# Patient Record
Sex: Female | Born: 1985 | Race: Black or African American | Hispanic: No | Marital: Married | Smoking: Never smoker
Health system: Southern US, Community
[De-identification: ages and names within clinical notes are randomized; demographics above are authoritative.]

## PROBLEM LIST (undated history)

## (undated) ENCOUNTER — Inpatient Hospital Stay (HOSPITAL_COMMUNITY): Payer: Self-pay

## (undated) DIAGNOSIS — R609 Edema, unspecified: Secondary | ICD-10-CM

## (undated) DIAGNOSIS — D649 Anemia, unspecified: Secondary | ICD-10-CM

## (undated) DIAGNOSIS — F419 Anxiety disorder, unspecified: Secondary | ICD-10-CM

## (undated) DIAGNOSIS — N63 Unspecified lump in unspecified breast: Secondary | ICD-10-CM

## (undated) DIAGNOSIS — Z5189 Encounter for other specified aftercare: Secondary | ICD-10-CM

## (undated) DIAGNOSIS — R87619 Unspecified abnormal cytological findings in specimens from cervix uteri: Secondary | ICD-10-CM

## (undated) DIAGNOSIS — K429 Umbilical hernia without obstruction or gangrene: Secondary | ICD-10-CM

## (undated) DIAGNOSIS — K589 Irritable bowel syndrome without diarrhea: Secondary | ICD-10-CM

## (undated) HISTORY — PX: COLPOSCOPY: SHX161

## (undated) HISTORY — DX: Irritable bowel syndrome, unspecified: K58.9

## (undated) HISTORY — DX: Anxiety disorder, unspecified: F41.9

## (undated) HISTORY — PX: COLONOSCOPY: SHX174

## (undated) HISTORY — DX: Edema, unspecified: R60.9

## (undated) HISTORY — DX: Umbilical hernia without obstruction or gangrene: K42.9

---
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
## 2007-05-13 ENCOUNTER — Emergency Department (HOSPITAL_COMMUNITY): Admission: EM | Admit: 2007-05-13 | Discharge: 2007-05-13 | Payer: Self-pay | Admitting: Emergency Medicine

## 2007-06-08 ENCOUNTER — Emergency Department (HOSPITAL_COMMUNITY): Admission: EM | Admit: 2007-06-08 | Discharge: 2007-06-08 | Payer: Self-pay | Admitting: Emergency Medicine

## 2007-07-03 ENCOUNTER — Emergency Department (HOSPITAL_COMMUNITY): Admission: EM | Admit: 2007-07-03 | Discharge: 2007-07-03 | Payer: Self-pay | Admitting: Emergency Medicine

## 2007-07-09 ENCOUNTER — Emergency Department (HOSPITAL_COMMUNITY): Admission: EM | Admit: 2007-07-09 | Discharge: 2007-07-09 | Payer: Self-pay | Admitting: Family Medicine

## 2007-07-23 ENCOUNTER — Emergency Department (HOSPITAL_COMMUNITY): Admission: EM | Admit: 2007-07-23 | Discharge: 2007-07-24 | Payer: Self-pay | Admitting: Emergency Medicine

## 2007-08-12 ENCOUNTER — Emergency Department (HOSPITAL_COMMUNITY): Admission: EM | Admit: 2007-08-12 | Discharge: 2007-08-12 | Payer: Self-pay | Admitting: Emergency Medicine

## 2007-10-19 ENCOUNTER — Emergency Department (HOSPITAL_COMMUNITY): Admission: EM | Admit: 2007-10-19 | Discharge: 2007-10-19 | Payer: Self-pay | Admitting: Internal Medicine

## 2008-01-10 ENCOUNTER — Emergency Department (HOSPITAL_COMMUNITY): Admission: EM | Admit: 2008-01-10 | Discharge: 2008-01-10 | Payer: Self-pay | Admitting: Family Medicine

## 2008-01-24 ENCOUNTER — Inpatient Hospital Stay (HOSPITAL_COMMUNITY): Admission: AD | Admit: 2008-01-24 | Discharge: 2008-01-25 | Payer: Self-pay | Admitting: Family Medicine

## 2008-02-23 ENCOUNTER — Ambulatory Visit: Payer: Self-pay | Admitting: Obstetrics & Gynecology

## 2008-03-29 ENCOUNTER — Inpatient Hospital Stay (HOSPITAL_COMMUNITY): Admission: AD | Admit: 2008-03-29 | Discharge: 2008-03-29 | Payer: Self-pay | Admitting: Family Medicine

## 2008-05-17 ENCOUNTER — Ambulatory Visit: Payer: Self-pay | Admitting: Advanced Practice Midwife

## 2008-05-17 ENCOUNTER — Inpatient Hospital Stay (HOSPITAL_COMMUNITY): Admission: AD | Admit: 2008-05-17 | Discharge: 2008-05-17 | Payer: Self-pay | Admitting: Obstetrics and Gynecology

## 2008-06-05 ENCOUNTER — Inpatient Hospital Stay (HOSPITAL_COMMUNITY): Admission: AD | Admit: 2008-06-05 | Discharge: 2008-06-05 | Payer: Self-pay | Admitting: Obstetrics and Gynecology

## 2008-07-03 ENCOUNTER — Inpatient Hospital Stay (HOSPITAL_COMMUNITY): Admission: AD | Admit: 2008-07-03 | Discharge: 2008-07-03 | Payer: Self-pay | Admitting: Obstetrics & Gynecology

## 2008-07-04 ENCOUNTER — Encounter: Payer: Self-pay | Admitting: Emergency Medicine

## 2008-07-04 ENCOUNTER — Inpatient Hospital Stay (HOSPITAL_COMMUNITY): Admission: AD | Admit: 2008-07-04 | Discharge: 2008-07-07 | Payer: Self-pay | Admitting: Obstetrics and Gynecology

## 2008-07-10 ENCOUNTER — Inpatient Hospital Stay (HOSPITAL_COMMUNITY): Admission: AD | Admit: 2008-07-10 | Discharge: 2008-07-10 | Payer: Self-pay | Admitting: Obstetrics and Gynecology

## 2008-08-10 ENCOUNTER — Inpatient Hospital Stay (HOSPITAL_COMMUNITY): Admission: AD | Admit: 2008-08-10 | Discharge: 2008-08-12 | Payer: Self-pay | Admitting: Obstetrics and Gynecology

## 2008-09-14 ENCOUNTER — Inpatient Hospital Stay (HOSPITAL_COMMUNITY): Admission: AD | Admit: 2008-09-14 | Discharge: 2008-09-15 | Payer: Self-pay | Admitting: Obstetrics and Gynecology

## 2008-10-25 ENCOUNTER — Inpatient Hospital Stay (HOSPITAL_COMMUNITY): Admission: AD | Admit: 2008-10-25 | Discharge: 2008-10-25 | Payer: Self-pay | Admitting: Obstetrics & Gynecology

## 2009-03-31 ENCOUNTER — Emergency Department (HOSPITAL_COMMUNITY): Admission: EM | Admit: 2009-03-31 | Discharge: 2009-03-31 | Payer: Self-pay | Admitting: Emergency Medicine

## 2009-10-20 ENCOUNTER — Emergency Department (HOSPITAL_COMMUNITY): Admission: EM | Admit: 2009-10-20 | Discharge: 2009-10-20 | Payer: Self-pay | Admitting: Family Medicine

## 2009-12-27 ENCOUNTER — Emergency Department (HOSPITAL_COMMUNITY): Admission: EM | Admit: 2009-12-27 | Discharge: 2009-12-27 | Payer: Self-pay | Admitting: Family Medicine

## 2010-05-17 LAB — POCT URINALYSIS DIPSTICK
Protein, ur: NEGATIVE mg/dL
Urobilinogen, UA: 2 mg/dL — ABNORMAL HIGH (ref 0.0–1.0)

## 2010-05-17 LAB — POCT PREGNANCY, URINE: Preg Test, Ur: NEGATIVE

## 2010-05-17 LAB — WET PREP, GENITAL
Trich, Wet Prep: NONE SEEN
Yeast Wet Prep HPF POC: NONE SEEN

## 2010-05-17 LAB — GC/CHLAMYDIA PROBE AMP, GENITAL: GC Probe Amp, Genital: NEGATIVE

## 2010-06-08 LAB — COMPREHENSIVE METABOLIC PANEL
ALT: 40 U/L — ABNORMAL HIGH (ref 0–35)
Albumin: 3.8 g/dL (ref 3.5–5.2)
Alkaline Phosphatase: 72 U/L (ref 39–117)
BUN: 8 mg/dL (ref 6–23)
CO2: 27 mEq/L (ref 19–32)
Calcium: 9.1 mg/dL (ref 8.4–10.5)
Chloride: 106 mEq/L (ref 96–112)
Creatinine, Ser: 0.63 mg/dL (ref 0.4–1.2)
Sodium: 138 mEq/L (ref 135–145)
Total Bilirubin: 0.4 mg/dL (ref 0.3–1.2)
Total Protein: 7.2 g/dL (ref 6.0–8.3)

## 2010-06-08 LAB — URINALYSIS, ROUTINE W REFLEX MICROSCOPIC
Glucose, UA: NEGATIVE mg/dL
Leukocytes, UA: NEGATIVE
Nitrite: NEGATIVE
Specific Gravity, Urine: 1.025 (ref 1.005–1.030)
Urobilinogen, UA: 0.2 mg/dL (ref 0.0–1.0)

## 2010-06-08 LAB — DIFFERENTIAL
Eosinophils Absolute: 0.1 10*3/uL (ref 0.0–0.7)
Eosinophils Relative: 2 % (ref 0–5)
Lymphocytes Relative: 34 % (ref 12–46)
Lymphs Abs: 1.4 10*3/uL (ref 0.7–4.0)
Monocytes Absolute: 0.3 10*3/uL (ref 0.1–1.0)
Monocytes Relative: 7 % (ref 3–12)
Neutro Abs: 2.4 10*3/uL (ref 1.7–7.7)

## 2010-06-08 LAB — URINE MICROSCOPIC-ADD ON

## 2010-06-08 LAB — CBC
Hemoglobin: 10.6 g/dL — ABNORMAL LOW (ref 12.0–15.0)
MCHC: 32.8 g/dL (ref 30.0–36.0)
Platelets: 284 10*3/uL (ref 150–400)
RDW: 15.5 % (ref 11.5–15.5)

## 2010-06-08 LAB — POCT PREGNANCY, URINE: Preg Test, Ur: NEGATIVE

## 2010-06-09 LAB — CBC
Hemoglobin: 11.2 g/dL — ABNORMAL LOW (ref 12.0–15.0)
RBC: 4.11 MIL/uL (ref 3.87–5.11)

## 2010-06-10 LAB — CBC
Hemoglobin: 11.2 g/dL — ABNORMAL LOW (ref 12.0–15.0)
MCHC: 33.5 g/dL (ref 30.0–36.0)
MCV: 82.7 fL (ref 78.0–100.0)
RBC: 4.45 MIL/uL (ref 3.87–5.11)
RDW: 23.9 % — ABNORMAL HIGH (ref 11.5–15.5)
WBC: 12.1 10*3/uL — ABNORMAL HIGH (ref 4.0–10.5)

## 2010-06-11 LAB — URINALYSIS, ROUTINE W REFLEX MICROSCOPIC
Hgb urine dipstick: NEGATIVE
Ketones, ur: NEGATIVE mg/dL
Protein, ur: NEGATIVE mg/dL
pH: 7.5 (ref 5.0–8.0)

## 2010-06-11 LAB — POCT I-STAT, CHEM 8
Chloride: 107 mEq/L (ref 96–112)
Creatinine, Ser: 0.6 mg/dL (ref 0.4–1.2)
Glucose, Bld: 123 mg/dL — ABNORMAL HIGH (ref 70–99)
HCT: 31 % — ABNORMAL LOW (ref 36.0–46.0)
Hemoglobin: 10.5 g/dL — ABNORMAL LOW (ref 12.0–15.0)
Potassium: 3.3 mEq/L — ABNORMAL LOW (ref 3.5–5.1)
Sodium: 136 mEq/L (ref 135–145)

## 2010-06-11 LAB — DIFFERENTIAL
Basophils Absolute: 0 10*3/uL (ref 0.0–0.1)
Basophils Relative: 0 % (ref 0–1)
Eosinophils Absolute: 0.1 10*3/uL (ref 0.0–0.7)
Eosinophils Relative: 1 % (ref 0–5)
Lymphocytes Relative: 17 % (ref 12–46)
Lymphs Abs: 1.3 10*3/uL (ref 0.7–4.0)
Monocytes Relative: 6 % (ref 3–12)
Neutrophils Relative %: 76 % (ref 43–77)

## 2010-06-11 LAB — CBC
HCT: 29.9 % — ABNORMAL LOW (ref 36.0–46.0)
Hemoglobin: 9.9 g/dL — ABNORMAL LOW (ref 12.0–15.0)
MCHC: 33.1 g/dL (ref 30.0–36.0)
MCV: 77.6 fL — ABNORMAL LOW (ref 78.0–100.0)
MCV: 79.5 fL (ref 78.0–100.0)
Platelets: 172 10*3/uL (ref 150–400)
RBC: 3.85 MIL/uL — ABNORMAL LOW (ref 3.87–5.11)
RDW: 21.4 % — ABNORMAL HIGH (ref 11.5–15.5)
RDW: 21.8 % — ABNORMAL HIGH (ref 11.5–15.5)
WBC: 8.9 10*3/uL (ref 4.0–10.5)

## 2010-06-11 LAB — RPR: RPR Ser Ql: NONREACTIVE

## 2010-06-13 LAB — URINALYSIS, ROUTINE W REFLEX MICROSCOPIC
Bilirubin Urine: NEGATIVE
Hgb urine dipstick: NEGATIVE
Ketones, ur: NEGATIVE mg/dL
Nitrite: NEGATIVE
Urobilinogen, UA: 0.2 mg/dL (ref 0.0–1.0)
pH: 7 (ref 5.0–8.0)

## 2010-06-17 LAB — URINALYSIS, ROUTINE W REFLEX MICROSCOPIC
Bilirubin Urine: NEGATIVE
Glucose, UA: 500 mg/dL — AB
Hgb urine dipstick: NEGATIVE
Specific Gravity, Urine: 1.01 (ref 1.005–1.030)
Urobilinogen, UA: 2 mg/dL — ABNORMAL HIGH (ref 0.0–1.0)

## 2010-06-17 LAB — GLUCOSE, CAPILLARY: Glucose-Capillary: 107 mg/dL — ABNORMAL HIGH (ref 70–99)

## 2010-06-29 ENCOUNTER — Inpatient Hospital Stay (HOSPITAL_COMMUNITY)
Admission: AD | Admit: 2010-06-29 | Discharge: 2010-06-29 | Disposition: A | Discharge status: Home / Self Care | Payer: BC Managed Care – PPO | Source: Ambulatory Visit | Attending: Obstetrics and Gynecology | Admitting: Obstetrics and Gynecology

## 2010-06-29 DIAGNOSIS — R1013 Epigastric pain: Secondary | ICD-10-CM | POA: Diagnosis present

## 2010-06-29 DIAGNOSIS — K219 Gastro-esophageal reflux disease without esophagitis: Secondary | ICD-10-CM | POA: Diagnosis not present

## 2010-06-29 LAB — POCT PREGNANCY, URINE: Preg Test, Ur: NEGATIVE

## 2010-06-29 LAB — URINALYSIS, ROUTINE W REFLEX MICROSCOPIC
Bilirubin Urine: NEGATIVE
Nitrite: NEGATIVE
Specific Gravity, Urine: 1.01 (ref 1.005–1.030)
Urobilinogen, UA: 0.2 mg/dL (ref 0.0–1.0)
pH: 6.5 (ref 5.0–8.0)

## 2010-07-05 ENCOUNTER — Inpatient Hospital Stay (HOSPITAL_COMMUNITY): Payer: BC Managed Care – PPO

## 2010-07-05 ENCOUNTER — Inpatient Hospital Stay (HOSPITAL_COMMUNITY)
Admission: AD | Admit: 2010-07-05 | Discharge: 2010-07-05 | Disposition: A | Discharge status: Home / Self Care | Payer: BC Managed Care – PPO | Source: Ambulatory Visit | Attending: Obstetrics & Gynecology | Admitting: Obstetrics & Gynecology

## 2010-07-05 DIAGNOSIS — R109 Unspecified abdominal pain: Secondary | ICD-10-CM

## 2010-07-05 DIAGNOSIS — O209 Hemorrhage in early pregnancy, unspecified: Secondary | ICD-10-CM | POA: Insufficient documentation

## 2010-07-05 LAB — URINALYSIS, ROUTINE W REFLEX MICROSCOPIC
Ketones, ur: 15 mg/dL — AB
Nitrite: NEGATIVE
Protein, ur: NEGATIVE mg/dL
Urobilinogen, UA: 1 mg/dL (ref 0.0–1.0)

## 2010-07-05 LAB — CBC
HCT: 34.6 % — ABNORMAL LOW (ref 36.0–46.0)
MCH: 25.4 pg — ABNORMAL LOW (ref 26.0–34.0)
MCV: 80.7 fL (ref 78.0–100.0)
Platelets: 284 10*3/uL (ref 150–400)
RDW: 14 % (ref 11.5–15.5)
WBC: 4.8 10*3/uL (ref 4.0–10.5)

## 2010-07-05 LAB — DIFFERENTIAL
Eosinophils Absolute: 0.1 10*3/uL (ref 0.0–0.7)
Eosinophils Relative: 3 % (ref 0–5)
Lymphocytes Relative: 40 % (ref 12–46)
Lymphs Abs: 1.9 10*3/uL (ref 0.7–4.0)
Monocytes Absolute: 0.4 10*3/uL (ref 0.1–1.0)
Monocytes Relative: 9 % (ref 3–12)

## 2010-07-05 LAB — POCT PREGNANCY, URINE: Preg Test, Ur: POSITIVE

## 2010-07-05 LAB — WET PREP, GENITAL: Yeast Wet Prep HPF POC: NONE SEEN

## 2010-07-06 LAB — GC/CHLAMYDIA PROBE AMP, GENITAL: Chlamydia, DNA Probe: NEGATIVE

## 2010-07-07 ENCOUNTER — Inpatient Hospital Stay (HOSPITAL_COMMUNITY)
Admission: AD | Admit: 2010-07-07 | Discharge: 2010-07-07 | Disposition: A | Discharge status: Home / Self Care | Payer: BC Managed Care – PPO | Source: Ambulatory Visit | Attending: Obstetrics & Gynecology | Admitting: Obstetrics & Gynecology

## 2010-07-07 DIAGNOSIS — O99891 Other specified diseases and conditions complicating pregnancy: Secondary | ICD-10-CM | POA: Insufficient documentation

## 2010-07-07 DIAGNOSIS — R109 Unspecified abdominal pain: Secondary | ICD-10-CM | POA: Insufficient documentation

## 2010-07-07 LAB — HCG, QUANTITATIVE, PREGNANCY: hCG, Beta Chain, Quant, S: 2197 m[IU]/mL — ABNORMAL HIGH (ref <5)

## 2010-07-08 ENCOUNTER — Other Ambulatory Visit: Payer: Self-pay | Admitting: Obstetrics & Gynecology

## 2010-07-08 DIAGNOSIS — R52 Pain, unspecified: Secondary | ICD-10-CM

## 2010-07-15 ENCOUNTER — Ambulatory Visit (HOSPITAL_COMMUNITY)
Admission: RE | Admit: 2010-07-15 | Discharge: 2010-07-15 | Disposition: A | Discharge status: Home / Self Care | Payer: Medicaid Other | Source: Ambulatory Visit | Attending: Obstetrics & Gynecology | Admitting: Obstetrics & Gynecology

## 2010-07-15 DIAGNOSIS — R52 Pain, unspecified: Secondary | ICD-10-CM

## 2010-07-15 DIAGNOSIS — Z3689 Encounter for other specified antenatal screening: Secondary | ICD-10-CM | POA: Insufficient documentation

## 2010-07-15 DIAGNOSIS — O99891 Other specified diseases and conditions complicating pregnancy: Secondary | ICD-10-CM | POA: Insufficient documentation

## 2010-07-15 DIAGNOSIS — R1031 Right lower quadrant pain: Secondary | ICD-10-CM | POA: Insufficient documentation
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
## 2010-09-24 ENCOUNTER — Inpatient Hospital Stay (HOSPITAL_COMMUNITY)
Admission: AD | Admit: 2010-09-24 | Discharge: 2010-09-24 | Disposition: A | Discharge status: Home / Self Care | Payer: BC Managed Care – PPO | Source: Ambulatory Visit | Attending: Obstetrics and Gynecology | Admitting: Obstetrics and Gynecology

## 2010-09-24 ENCOUNTER — Inpatient Hospital Stay (HOSPITAL_COMMUNITY): Payer: BC Managed Care – PPO

## 2010-09-24 ENCOUNTER — Encounter (HOSPITAL_COMMUNITY): Payer: Self-pay

## 2010-09-24 DIAGNOSIS — R109 Unspecified abdominal pain: Secondary | ICD-10-CM | POA: Insufficient documentation

## 2010-09-24 DIAGNOSIS — O4692 Antepartum hemorrhage, unspecified, second trimester: Secondary | ICD-10-CM

## 2010-09-24 DIAGNOSIS — O4690 Antepartum hemorrhage, unspecified, unspecified trimester: Secondary | ICD-10-CM | POA: Insufficient documentation

## 2010-09-24 DIAGNOSIS — O9989 Other specified diseases and conditions complicating pregnancy, childbirth and the puerperium: Secondary | ICD-10-CM | POA: Insufficient documentation

## 2010-09-24 DIAGNOSIS — R1084 Generalized abdominal pain: Secondary | ICD-10-CM

## 2010-09-24 HISTORY — DX: Anemia, unspecified: D64.9

## 2010-09-24 LAB — WET PREP, GENITAL
Trich, Wet Prep: NONE SEEN
Yeast Wet Prep HPF POC: NONE SEEN

## 2010-09-24 LAB — URINALYSIS, ROUTINE W REFLEX MICROSCOPIC
Hgb urine dipstick: NEGATIVE
Nitrite: NEGATIVE
Specific Gravity, Urine: 1.025 (ref 1.005–1.030)
Urobilinogen, UA: 0.2 mg/dL (ref 0.0–1.0)
pH: 7 (ref 5.0–8.0)

## 2010-09-24 LAB — ABO/RH: ABO/RH(D): O POS

## 2010-09-24 LAB — CBC
Hemoglobin: 11.3 g/dL — ABNORMAL LOW (ref 12.0–15.0)
Platelets: 225 10*3/uL (ref 150–400)
RBC: 4.31 MIL/uL (ref 3.87–5.11)
WBC: 9.1 10*3/uL (ref 4.0–10.5)

## 2010-09-24 LAB — URINE MICROSCOPIC-ADD ON

## 2010-09-24 MED ORDER — IBUPROFEN 200 MG PO TABS: 600.0000 mg | ORAL_TABLET | Freq: Four times a day (QID) | ORAL | Status: AC | PRN
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
## 2010-09-24 NOTE — ED Provider Notes (Signed)
[place]: SIUP, 16.0 weeks by early [place]. No evidence of abruption. Posterior placenta. +FHR
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
## 2010-09-25 LAB — GC/CHLAMYDIA PROBE AMP, GENITAL
Chlamydia, DNA Probe: NEGATIVE
GC Probe Amp, Genital: NEGATIVE

## 2010-11-20 ENCOUNTER — Inpatient Hospital Stay (HOSPITAL_COMMUNITY)
Admission: AD | Admit: 2010-11-20 | Discharge: 2010-11-20 | Disposition: A | Discharge status: Home / Self Care | Payer: BC Managed Care – PPO | Source: Ambulatory Visit | Attending: Obstetrics & Gynecology | Admitting: Obstetrics & Gynecology

## 2010-11-20 ENCOUNTER — Encounter (HOSPITAL_COMMUNITY): Payer: Self-pay | Admitting: *Deleted

## 2010-11-20 DIAGNOSIS — L02229 Furuncle of trunk, unspecified: Secondary | ICD-10-CM | POA: Insufficient documentation

## 2010-11-20 DIAGNOSIS — L738 Other specified follicular disorders: Secondary | ICD-10-CM | POA: Insufficient documentation

## 2010-11-20 DIAGNOSIS — L739 Follicular disorder, unspecified: Secondary | ICD-10-CM | POA: Diagnosis present

## 2010-11-20 DIAGNOSIS — L02239 Carbuncle of trunk, unspecified: Secondary | ICD-10-CM | POA: Insufficient documentation

## 2010-11-20 DIAGNOSIS — O99891 Other specified diseases and conditions complicating pregnancy: Secondary | ICD-10-CM | POA: Insufficient documentation

## 2010-11-20 HISTORY — DX: Unspecified abnormal cytological findings in specimens from cervix uteri: R87.619

## 2010-11-20 MED ORDER — SULFAMETHOXAZOLE-TRIMETHOPRIM 800-160 MG PO TABS: 1.0000 | ORAL_TABLET | Freq: Two times a day (BID) | ORAL | Status: AC

## 2010-11-20 NOTE — ED Provider Notes (Signed)
History     Chief Complaint  Patient presents with  . Recurrent Skin Infections   HPI Boil on right groin x 2 weeks, getting worse, seems to become more inflamed with warm compresses. Had boil in the same place about 1-2 months ago, went away after popping it, but then came back. Prenatal care with Dr. Arlyce Dice, normal prenatal course.   OB History    Grav Para Term Preterm Abortions TAB SAB Ect Mult Living   4 3 2 1      3       Past Medical History  Diagnosis Date  . Anemia   . Abnormal Pap smear of cervix     Past Surgical History  Procedure Date  . Colposcopy     No family history on file.  History  Substance Use Topics  . Smoking status: Never Smoker   . Smokeless tobacco: Not on file  . Alcohol Use: No    Allergies:  Allergies  Allergen Reactions  . Penicillins Anaphylaxis    Prescriptions prior to admission  Medication Sig Dispense Refill  . prenatal vitamin w/FE, FA (PRENATAL 1 + 1) 27-1 MG TABS Take 1 tablet by mouth daily.        Marland Kitchen acetaminophen (TYLENOL) 500 MG tablet Take 1,000 mg by mouth every 6 (six) hours as needed.          Review of Systems  Constitutional: Negative.   Respiratory: Negative.   Cardiovascular: Negative.   Gastrointestinal: Negative for nausea, vomiting, abdominal pain, diarrhea and constipation.  Genitourinary: Negative for dysuria, urgency, frequency, hematuria and flank pain.       Negative for vaginal bleeding, cramping/contractions  Musculoskeletal: Negative.   Skin:       + lesion on groin   Neurological: Negative.   Psychiatric/Behavioral: Negative.    Physical Exam   Blood pressure 129/80, pulse 85, temperature 98.4 F (36.9 C), temperature source Oral, resp. rate 16, height 5\' 6"  (1.676 m), weight 88.565 kg (195 lb 4 oz), last menstrual period 05/26/2010.  Physical Exam  Nursing note and vitals reviewed. Constitutional: She is oriented to person, place, and time. She appears well-developed and well-nourished.  No distress.  Cardiovascular: Normal rate.   Respiratory: Effort normal.  GI: Soft. There is no tenderness.  Musculoskeletal: Normal range of motion.  Lymphadenopathy:       Right: Inguinal adenopathy present.  Neurological: She is alert and oriented to person, place, and time.  Skin: Skin is warm and dry.     Psychiatric: She has a normal mood and affect.    MAU Course  Procedures  Rev'd with Dr. Aldona Bar, ok to I&D, send home with rx for abx with MRSA coverage  I&D of right groin folliculitis - area cleansed x 3 with betadine, small incision made centrally in each of two lesions, large amount of purulent drainage evacuated, culture collected. Pt tolerated procedure well.   Assessment and Plan  25 y.o. [id number] at [redacted]w[redacted]d Folliculitis s/p I&D Rx Bactrim DS bid x 10 days F/U in office as scheduled next week  Devarius Nelles 11/20/2010, 12:26 PM

## 2010-11-20 NOTE — Progress Notes (Signed)
Pt states she had ? Ingrown hair/boil a few weeks ago, now has another ?boil in same location, applied heat compress to area on r groin. Painful with walking.

## 2010-11-24 LAB — CULTURE, ROUTINE-ABSCESS

## 2010-11-25 LAB — URINALYSIS, ROUTINE W REFLEX MICROSCOPIC
Bilirubin Urine: NEGATIVE
Hgb urine dipstick: NEGATIVE
Nitrite: NEGATIVE
Protein, ur: NEGATIVE
Specific Gravity, Urine: 1.026
Urobilinogen, UA: 1

## 2010-11-25 LAB — I-STAT 8, (EC8 V) (CONVERTED LAB)
BUN: 11
Chloride: 106
Glucose, Bld: 91
HCT: 38
Hemoglobin: 12.9
Operator id: 146091
Sodium: 139

## 2010-11-26 LAB — URINALYSIS, ROUTINE W REFLEX MICROSCOPIC
Nitrite: NEGATIVE
Protein, ur: NEGATIVE
Specific Gravity, Urine: 1.023
Urobilinogen, UA: 0.2

## 2010-11-26 LAB — WET PREP, GENITAL: WBC, Wet Prep HPF POC: NONE SEEN

## 2010-11-26 LAB — RPR: RPR Ser Ql: NONREACTIVE

## 2010-11-26 LAB — POCT PREGNANCY, URINE: Preg Test, Ur: NEGATIVE

## 2010-11-27 LAB — URINALYSIS, ROUTINE W REFLEX MICROSCOPIC
Hgb urine dipstick: NEGATIVE
Specific Gravity, Urine: 1.02
Urobilinogen, UA: 1
pH: 6.5

## 2010-11-27 LAB — POCT PREGNANCY, URINE
Operator id: 173591
Preg Test, Ur: NEGATIVE

## 2010-11-27 LAB — GC/CHLAMYDIA PROBE AMP, GENITAL: GC Probe Amp, Genital: NEGATIVE

## 2010-11-27 LAB — WET PREP, GENITAL

## 2010-11-28 LAB — POCT PREGNANCY, URINE
Operator id: 239701
Preg Test, Ur: NEGATIVE

## 2010-11-28 LAB — POCT URINALYSIS DIP (DEVICE)
Bilirubin Urine: NEGATIVE
Nitrite: NEGATIVE
Protein, ur: 30 — AB
pH: 6

## 2010-12-03 LAB — POCT URINALYSIS DIP (DEVICE)
Hgb urine dipstick: NEGATIVE
Nitrite: NEGATIVE
Protein, ur: NEGATIVE mg/dL
Specific Gravity, Urine: 1.02 (ref 1.005–1.030)
Urobilinogen, UA: 0.2 mg/dL (ref 0.0–1.0)
pH: 7 (ref 5.0–8.0)

## 2010-12-03 LAB — URINALYSIS, ROUTINE W REFLEX MICROSCOPIC
Bilirubin Urine: NEGATIVE
Glucose, UA: 500 — AB
Hgb urine dipstick: NEGATIVE
Ketones, ur: NEGATIVE
Nitrite: NEGATIVE
Specific Gravity, Urine: 1.015
pH: 7.5

## 2010-12-03 LAB — WET PREP, GENITAL
Clue Cells Wet Prep HPF POC: NONE SEEN
Yeast Wet Prep HPF POC: NONE SEEN

## 2010-12-03 LAB — GC/CHLAMYDIA PROBE AMP, GENITAL
Chlamydia, DNA Probe: NEGATIVE
GC Probe Amp, Genital: NEGATIVE

## 2010-12-03 LAB — GLUCOSE, CAPILLARY

## 2010-12-06 LAB — ABO/RH: RH Type: POSITIVE

## 2010-12-06 LAB — POCT URINALYSIS DIP (DEVICE)
Bilirubin Urine: NEGATIVE
Glucose, UA: NEGATIVE mg/dL
Hgb urine dipstick: NEGATIVE
Specific Gravity, Urine: 1.02 (ref 1.005–1.030)
Urobilinogen, UA: 0.2 mg/dL (ref 0.0–1.0)
pH: 7 (ref 5.0–8.0)

## 2010-12-06 LAB — HIV ANTIBODY (ROUTINE TESTING W REFLEX): HIV: NONREACTIVE

## 2010-12-13 LAB — GC/CHLAMYDIA PROBE AMP, GENITAL
Chlamydia, DNA Probe: NEGATIVE
GC Probe Amp, Genital: NEGATIVE

## 2010-12-13 LAB — URINALYSIS, ROUTINE W REFLEX MICROSCOPIC
Bilirubin Urine: NEGATIVE
Glucose, UA: NEGATIVE
Specific Gravity, Urine: 1.021

## 2010-12-13 LAB — URINE MICROSCOPIC-ADD ON

## 2011-02-07 ENCOUNTER — Encounter (HOSPITAL_COMMUNITY): Payer: Self-pay

## 2011-02-07 ENCOUNTER — Observation Stay (HOSPITAL_COMMUNITY)
Admission: AD | Admit: 2011-02-07 | Discharge: 2011-02-08 | Disposition: A | Discharge status: Home / Self Care | Payer: BC Managed Care – PPO | Source: Ambulatory Visit | Attending: Obstetrics and Gynecology | Admitting: Obstetrics and Gynecology

## 2011-02-07 DIAGNOSIS — O4700 False labor before 37 completed weeks of gestation, unspecified trimester: Principal | ICD-10-CM | POA: Insufficient documentation

## 2011-02-07 HISTORY — DX: Encounter for other specified aftercare: Z51.89

## 2011-02-07 LAB — URINALYSIS, ROUTINE W REFLEX MICROSCOPIC
Leukocytes, UA: NEGATIVE
Nitrite: NEGATIVE
Specific Gravity, Urine: 1.025 (ref 1.005–1.030)
Urobilinogen, UA: 0.2 mg/dL (ref 0.0–1.0)

## 2011-02-07 MED ORDER — HYDROXYZINE HCL 50 MG/ML IM SOLN
50.0000 mg | INTRAMUSCULAR | Status: DC | PRN
  Filled 2011-02-07: qty 1

## 2011-02-07 MED ORDER — ZOLPIDEM TARTRATE 10 MG PO TABS
10.0000 mg | ORAL_TABLET | Freq: Once | ORAL | Status: AC
  Administered 2011-02-07: 10 mg via ORAL
  Filled 2011-02-07: qty 1

## 2011-02-07 MED ORDER — OXYTOCIN 20 UNITS IN LACTATED RINGERS INFUSION - SIMPLE: 125.0000 mL/h | Freq: Once | INTRAVENOUS | Status: DC

## 2011-02-07 MED ORDER — LACTATED RINGERS IV SOLN: INTRAVENOUS | Status: DC

## 2011-02-07 MED ORDER — ONDANSETRON HCL 4 MG/2ML IJ SOLN: 4.0000 mg | Freq: Four times a day (QID) | INTRAMUSCULAR | Status: DC | PRN

## 2011-02-07 MED ORDER — LACTATED RINGERS IV SOLN: 500.0000 mL | INTRAVENOUS | Status: DC | PRN

## 2011-02-07 MED ORDER — OXYTOCIN BOLUS FROM INFUSION
500.0000 mL | Freq: Once | INTRAVENOUS | Status: DC
  Filled 2011-02-07: qty 500

## 2011-02-07 MED ORDER — CITRIC ACID-SODIUM CITRATE 334-500 MG/5ML PO SOLN: 30.0000 mL | ORAL | Status: DC | PRN

## 2011-02-07 MED ORDER — BUTORPHANOL TARTRATE 2 MG/ML IJ SOLN: 1.0000 mg | INTRAMUSCULAR | Status: DC | PRN

## 2011-02-07 MED ORDER — CEFAZOLIN SODIUM 1-5 GM-% IV SOLN
1.0000 g | Freq: Three times a day (TID) | INTRAVENOUS | Status: DC
  Filled 2011-02-07: qty 50

## 2011-02-07 MED ORDER — OXYCODONE-ACETAMINOPHEN 5-325 MG PO TABS: 2.0000 | ORAL_TABLET | ORAL | Status: DC | PRN

## 2011-02-07 MED ORDER — NALBUPHINE SYRINGE 5 MG/0.5 ML
10.0000 mg | INJECTION | INTRAMUSCULAR | Status: DC | PRN
  Filled 2011-02-07: qty 1

## 2011-02-07 MED ORDER — FLEET ENEMA 7-19 GM/118ML RE ENEM: 1.0000 | ENEMA | RECTAL | Status: DC | PRN

## 2011-02-07 MED ORDER — CEFAZOLIN SODIUM-DEXTROSE 2-3 GM-% IV SOLR
2.0000 g | Freq: Once | INTRAVENOUS | Status: DC
  Filled 2011-02-07: qty 50

## 2011-02-07 MED ORDER — LIDOCAINE HCL (PF) 1 % IJ SOLN: 30.0000 mL | INTRAMUSCULAR | Status: DC | PRN

## 2011-02-07 MED ORDER — ACETAMINOPHEN 325 MG PO TABS: 650.0000 mg | ORAL_TABLET | ORAL | Status: DC | PRN

## 2011-02-07 MED ORDER — IBUPROFEN 600 MG PO TABS: 600.0000 mg | ORAL_TABLET | Freq: Four times a day (QID) | ORAL | Status: DC | PRN

## 2011-02-07 NOTE — ED Notes (Signed)
Report given to K. Ross MD patient G4P3 36.5 c/o labor, contractions every 3-4 minutes mild to moderate, fhr reactive, cervix 2.5/70/-1, per Dr. Tenny Craw sent CCUA, either monitor patient for additional 1-2 hours or have patient walk.

## 2011-02-07 NOTE — H&P (Signed)
Patricia Macdonald is a 25 y.o. [phone] @ 36+5 presenting for uncomfortable contractions.   Pt has been having irregular contractions for several days.  Pregnancy complicated by late prenatal care.  Denies LOF or bleeding.  Cervix 2-3/70/-1 and has not changed in several hours.  Given preterm status patient understands that labor augmentation will not occur unless her cervix changes. Pt admitted for obs due to painful contractions. History OB History    Grav Para Term Preterm Abortions TAB SAB Ect Mult Living   4 3 2 1      3      Past Medical History  Diagnosis Date  . Anemia   . Abnormal Pap smear of cervix    Past Surgical History  Procedure Date  . Colposcopy    Family History: family history includes Diabetes in her mother. Social History:  reports that she has never smoked. She does not have any smokeless tobacco history on file. She reports that she does not drink alcohol or use illicit drugs.  ROS: as above  Dilation: 2.5 Effacement (%): 70 Station: -1 Exam by:: SBeck, RN Blood pressure 129/87, pulse 104, temperature 99.1 F (37.3 C), temperature source Oral, resp. rate 20, height 5\' 6"  (1.676 m), weight 93.985 kg (207 lb 3.2 oz), last menstrual period 05/26/2010, SpO2 97.00%. Exam Physical Exam  Prenatal labs: ABO, Rh: --/--/O POS (07/24 2050) Antibody:  Negative Rubella:  Immune RPR:   NR HBsAg:   Neg HIV:   NR GBS:     Assessment/Plan: Admit for obs Pain medication Ambien  Bryden Darden H. 02/07/2011, 7:57 PM

## 2011-02-07 NOTE — Progress Notes (Signed)
MD phone for order clarification per pharmacy. New orders received.

## 2011-02-07 NOTE — Progress Notes (Signed)
Patient states she has been having contractions every 4 minutes for about one hour. Has had a white vaginal discharge that made her panties wet. Reports good fetal movement. No bleeding.

## 2011-02-07 NOTE — ED Notes (Signed)
Patient was taken off efm to walk, plan of care was discussed with patient, she verbalized an understanding.

## 2011-02-08 MED ORDER — ZOLPIDEM TARTRATE 10 MG PO TABS: 10.0000 mg | ORAL_TABLET | Freq: Every evening | ORAL | Status: DC | PRN

## 2011-02-08 NOTE — Discharge Summary (Signed)
Obstetric Discharge Summary Reason for Admission: observation/evaluation and threatened labor Prenatal Procedures: NST and ultrasound Intrapartum Procedures: Pain medication and sleeping medication Postpartum Procedures: N/A Complications-Operative and Postpartum: none Hemoglobin  Date Value Range Status  09/24/2010 11.3* 12.0-15.0 (g/dL) Final     HCT  Date Value Range Status  09/24/2010 34.2* 36.0-46.0 (%) Final    Discharge Diagnoses: False labor-undelivered  Discharge Information: Date: 02/08/2011 Activity: unrestricted Diet: routine Medications: Ambien Condition: stable Instructions: refer to practice specific booklet Discharge to: home Follow-up Information    Please follow up. (Keep your regularly scheduled prenatal visit next week)          Eilyn Polack H. 02/08/2011, 11:07 AM

## 2011-02-15 ENCOUNTER — Inpatient Hospital Stay (HOSPITAL_COMMUNITY)
Admission: AD | Admit: 2011-02-15 | Discharge: 2011-02-15 | Disposition: A | Discharge status: Home / Self Care | Payer: BC Managed Care – PPO | Source: Ambulatory Visit | Attending: Obstetrics and Gynecology | Admitting: Obstetrics and Gynecology

## 2011-02-15 ENCOUNTER — Encounter (HOSPITAL_COMMUNITY): Payer: Self-pay | Admitting: *Deleted

## 2011-02-15 DIAGNOSIS — O479 False labor, unspecified: Secondary | ICD-10-CM | POA: Insufficient documentation

## 2011-02-15 NOTE — Progress Notes (Signed)
Will reck cervix at 2215. Pt's ride home needs to leave

## 2011-02-15 NOTE — Progress Notes (Signed)
Written and verbal d/c instructions given and understanding voiced. May use Tylenol for low back pain. Sheet for 'Low back pain in preg.' given.

## 2011-02-15 NOTE — Progress Notes (Signed)
Dr Claiborne Billings notified of pt's admission and status. Aware of ctx pattern, sve, low back pain. If no change in an hour pt may go home.

## 2011-02-15 NOTE — Progress Notes (Signed)
G4P3 at 37.6wks. Ctxs for about an hour. Lower back pain

## 2011-02-27 ENCOUNTER — Inpatient Hospital Stay (HOSPITAL_COMMUNITY)
Admission: AD | Admit: 2011-02-27 | Discharge: 2011-03-01 | DRG: 373 | Disposition: A | Discharge status: Home / Self Care | Payer: BC Managed Care – PPO | Source: Ambulatory Visit | Attending: Obstetrics and Gynecology | Admitting: Obstetrics and Gynecology

## 2011-02-27 DIAGNOSIS — Z3480 Encounter for supervision of other normal pregnancy, unspecified trimester: Secondary | ICD-10-CM

## 2011-02-27 MED ORDER — LACTATED RINGERS IV SOLN: INTRAVENOUS | Status: DC

## 2011-02-27 MED ORDER — FLEET ENEMA 7-19 GM/118ML RE ENEM: 1.0000 | ENEMA | RECTAL | Status: DC | PRN

## 2011-02-27 MED ORDER — ONDANSETRON HCL 4 MG PO TABS
4.0000 mg | ORAL_TABLET | ORAL | Status: DC | PRN
  Administered 2011-02-28: 4 mg via ORAL
  Filled 2011-02-27: qty 1

## 2011-02-27 MED ORDER — LACTATED RINGERS IV SOLN: 500.0000 mL | INTRAVENOUS | Status: DC | PRN

## 2011-02-27 MED ORDER — OXYCODONE-ACETAMINOPHEN 5-325 MG PO TABS
2.0000 | ORAL_TABLET | ORAL | Status: DC | PRN
  Administered 2011-02-27: 2 via ORAL
  Filled 2011-02-27: qty 2

## 2011-02-27 MED ORDER — SIMETHICONE 80 MG PO CHEW: 80.0000 mg | CHEWABLE_TABLET | ORAL | Status: DC | PRN

## 2011-02-27 MED ORDER — ONDANSETRON HCL 4 MG/2ML IJ SOLN: 4.0000 mg | INTRAMUSCULAR | Status: DC | PRN

## 2011-02-27 MED ORDER — BENZOCAINE-MENTHOL 20-0.5 % EX AERO: 1.0000 "application " | INHALATION_SPRAY | CUTANEOUS | Status: DC | PRN

## 2011-02-27 MED ORDER — IBUPROFEN 600 MG PO TABS
600.0000 mg | ORAL_TABLET | Freq: Four times a day (QID) | ORAL | Status: DC | PRN
  Administered 2011-02-27: 600 mg via ORAL
  Filled 2011-02-27: qty 1

## 2011-02-27 MED ORDER — TETANUS-DIPHTH-ACELL PERTUSSIS 5-2.5-18.5 LF-MCG/0.5 IM SUSP: 0.5000 mL | Freq: Once | INTRAMUSCULAR | Status: DC

## 2011-02-27 MED ORDER — ACETAMINOPHEN 325 MG PO TABS: 650.0000 mg | ORAL_TABLET | ORAL | Status: DC | PRN

## 2011-02-27 MED ORDER — OXYTOCIN BOLUS FROM INFUSION
500.0000 mL | Freq: Once | INTRAVENOUS | Status: DC
  Filled 2011-02-27: qty 500

## 2011-02-27 MED ORDER — IBUPROFEN 600 MG PO TABS
600.0000 mg | ORAL_TABLET | Freq: Four times a day (QID) | ORAL | Status: DC
  Administered 2011-02-28 – 2011-03-01 (×6): 600 mg via ORAL
  Filled 2011-02-27 (×6): qty 1

## 2011-02-27 MED ORDER — PRENATAL MULTIVITAMIN CH
1.0000 | ORAL_TABLET | Freq: Every day | ORAL | Status: DC
Start: 2011-02-28 — End: 2011-03-01
  Administered 2011-02-28 – 2011-03-01 (×2): 1 via ORAL
  Filled 2011-02-27 (×2): qty 1

## 2011-02-27 MED ORDER — LIDOCAINE HCL (PF) 1 % IJ SOLN
INTRAMUSCULAR | Status: AC
  Filled 2011-02-27: qty 30

## 2011-02-27 MED ORDER — OXYTOCIN 20 UNITS IN LACTATED RINGERS INFUSION - SIMPLE
INTRAVENOUS | Status: AC
  Administered 2011-02-27: 125 mL/h via INTRAVENOUS
  Filled 2011-02-27: qty 1000

## 2011-02-27 MED ORDER — OXYTOCIN 20 UNITS IN LACTATED RINGERS INFUSION - SIMPLE
125.0000 mL/h | Freq: Once | INTRAVENOUS | Status: AC
  Administered 2011-02-27: 125 mL/h via INTRAVENOUS

## 2011-02-27 MED ORDER — DIPHENHYDRAMINE HCL 25 MG PO CAPS: 25.0000 mg | ORAL_CAPSULE | Freq: Four times a day (QID) | ORAL | Status: DC | PRN

## 2011-02-27 MED ORDER — WITCH HAZEL-GLYCERIN EX PADS: 1.0000 "application " | MEDICATED_PAD | CUTANEOUS | Status: DC | PRN

## 2011-02-27 MED ORDER — DIBUCAINE 1 % RE OINT: 1.0000 "application " | TOPICAL_OINTMENT | RECTAL | Status: DC | PRN

## 2011-02-27 MED ORDER — ZOLPIDEM TARTRATE 5 MG PO TABS: 5.0000 mg | ORAL_TABLET | Freq: Every evening | ORAL | Status: DC | PRN

## 2011-02-27 MED ORDER — LANOLIN HYDROUS EX OINT
TOPICAL_OINTMENT | CUTANEOUS | Status: DC | PRN
Start: 2011-02-27 — End: 2011-03-01

## 2011-02-27 MED ORDER — SENNOSIDES-DOCUSATE SODIUM 8.6-50 MG PO TABS
2.0000 | ORAL_TABLET | Freq: Every day | ORAL | Status: DC
  Administered 2011-02-28: 2 via ORAL

## 2011-02-27 MED ORDER — LIDOCAINE HCL (PF) 1 % IJ SOLN: 30.0000 mL | INTRAMUSCULAR | Status: DC | PRN

## 2011-02-27 MED ORDER — OXYCODONE-ACETAMINOPHEN 5-325 MG PO TABS
1.0000 | ORAL_TABLET | ORAL | Status: DC | PRN
  Administered 2011-02-28: 2 via ORAL
  Administered 2011-02-28: 1 via ORAL
  Administered 2011-02-28: 2 via ORAL
  Administered 2011-02-28: 1 via ORAL
  Administered 2011-02-28 (×2): 2 via ORAL
  Administered 2011-03-01: 1 via ORAL
  Administered 2011-03-01: 2 via ORAL
  Filled 2011-02-27: qty 2
  Filled 2011-02-27: qty 1
  Filled 2011-02-27 (×4): qty 2
  Filled 2011-02-27 (×2): qty 1

## 2011-02-27 MED ORDER — ONDANSETRON HCL 4 MG/2ML IJ SOLN: 4.0000 mg | Freq: Four times a day (QID) | INTRAMUSCULAR | Status: DC | PRN

## 2011-02-27 MED ORDER — CITRIC ACID-SODIUM CITRATE 334-500 MG/5ML PO SOLN: 30.0000 mL | ORAL | Status: DC | PRN

## 2011-02-27 NOTE — ED Notes (Signed)
Pt to room 165 via stretcher.

## 2011-02-27 NOTE — Progress Notes (Signed)
Pt arrived via EMS for labor check.  Pt to room 6.  SVE 9cm.  Report called to Dr. Claiborne Billings admission orders rec'd.

## 2011-02-27 NOTE — Progress Notes (Signed)
Called by MAU for patient presenting by EMS @9cm , 3 prior vaginal deliveries.  At my arrival pt had precipitously delivered without complication a female infant 8#6 with APGARS 9/9.  No lacerations.  EBL 250 per nurse midwife who attended delivery.  Patient now doing well.

## 2011-02-27 NOTE — H&P (Addendum)
25 y.o. @ [phone] comes in by EMS in labor.  Otherwise has good fetal movement and no bleeding.  Past Medical History  Diagnosis Date  . Anemia   . Abnormal Pap smear of cervix   . Blood transfusion 2005  . Postpartum hemorrhage 09/2003    Past Surgical History  Procedure Date  . Colposcopy     OB History    Grav Para Term Preterm Abortions TAB SAB Ect Mult Living   4 3 2 1      3      # Outc Date GA Lbr Len/2nd Wgt Sex Del Anes PTL Lv   1 TRM            2 TRM            3 PRE            4 CUR               History   Social History  . Marital Status: Married    Spouse Name: N/A    Number of Children: N/A  . Years of Education: N/A   Occupational History  . Not on file.   Social History Main Topics  . Smoking status: Never Smoker   . Smokeless tobacco: Not on file  . Alcohol Use: No  . Drug Use: No  . Sexually Active: Yes   Other Topics Concern  . Not on file   Social History Narrative  . No narrative on file   Penicillins and Latex   Prenatal Course:  LPNC, h/o pp hemorrhage with first delivery requiring transfusion.  There were no vitals filed for this visit.   Lungs/Cor:  NAD Abdomen:  soft, gravid Ex:  no cords, erythema SVE:  9 FHTs:  Limited strip available - ~140, good STV, early decels Toco:  q2   A/P  Admitted for labor Delivered precipitously Routine pp care Patient desired circumcision.  GBS Neg  Gerilynn Mccullars

## 2011-02-28 ENCOUNTER — Encounter (HOSPITAL_COMMUNITY): Payer: Self-pay | Admitting: *Deleted

## 2011-02-28 LAB — CBC
Hemoglobin: 9.4 g/dL — ABNORMAL LOW (ref 12.0–15.0)
RBC: 3.92 MIL/uL (ref 3.87–5.11)

## 2011-02-28 NOTE — Progress Notes (Signed)
Mom and Baby doing well.  Breast feeding.  Does desire circ; procedure discussed and permit signed.  CBC today: 9.4/10.3 with 173 K platelets.

## 2011-03-01 NOTE — Discharge Summary (Signed)
Discharge diagnoses-  #1-38 week and 2 day intrauterine pregnancy delivered 8 lbs. 6 oz. Female infant Apgars 9 and 9  #2-blood type O-positive  Procedures-  Precipitous normal spontaneous delivery  Summary-this 25 year old gravida 4 now para 4 who presented for office late in her pregnancy for care for this pregnancy was delivered short after arrival by the rescue squad on the early evening of 12/27 of an 8 lbs. 6 oz. Female infant with Apgars of 9 and 9 over intact perineum. Her pregnancy was relatively uncomplicated although she did notice some antenatal visits in addition she presented late for prenatal care.  Her baby was circumcised on 12/28.  On the morning of 12/29 the mother was ambulating well tolerating a regular diet well having normal bowel and bladder function her breast-feeding was going well and she was desirous of discharge. Her CBC on 12/28 was 9.4-10.3 with a platelet count 173,000.  She was given all appropriate instructions per discharge brochure on the morning of 12/29 and understood all instructions well. She will arrange followup our office in 4 weeks' time with Dr. Claiborne Billings and expressed a desire to discuss a permanent elective sterilization procedure. She was offered Depo-Provera the time of discharge but said that she would abstain from intercourse until her sterilization procedure was carried out which hopefully can be scheduled shortly after her postpartum visit in 4 weeks time.  Discharge medications include vitamins-1 a day, Feosol capsules or the equivalent thereof one daily, and she was given prescriptions for Motrin 600 mg to use every 6 hours for cramping or mild pain and Percocet I./325 to use every 4-6 hours( 1-2 ) for more severe pain.  Condition on discharge improved

## 2011-03-27 ENCOUNTER — Other Ambulatory Visit: Payer: Self-pay | Admitting: Obstetrics and Gynecology

## 2011-04-14 ENCOUNTER — Encounter (HOSPITAL_COMMUNITY): Payer: Self-pay

## 2011-04-28 ENCOUNTER — Inpatient Hospital Stay (HOSPITAL_COMMUNITY): Admission: RE | Admit: 2011-04-28 | Discharge: 2011-04-28 | Payer: BC Managed Care – PPO | Source: Ambulatory Visit

## 2011-05-01 ENCOUNTER — Encounter (HOSPITAL_COMMUNITY): Admission: RE | Payer: Self-pay | Source: Ambulatory Visit

## 2011-05-01 ENCOUNTER — Ambulatory Visit (HOSPITAL_COMMUNITY)
Admission: RE | Admit: 2011-05-01 | Payer: BC Managed Care – PPO | Source: Ambulatory Visit | Admitting: Obstetrics and Gynecology

## 2011-05-01 SURGERY — LIGATION, FALLOPIAN TUBE, LAPAROSCOPIC
Anesthesia: Choice

## 2011-12-01 ENCOUNTER — Emergency Department (INDEPENDENT_AMBULATORY_CARE_PROVIDER_SITE_OTHER)
Admission: EM | Admit: 2011-12-01 | Discharge: 2011-12-01 | Disposition: A | Discharge status: Home / Self Care | Payer: Self-pay | Source: Home / Self Care | Attending: Family Medicine | Admitting: Family Medicine

## 2011-12-01 ENCOUNTER — Encounter (HOSPITAL_COMMUNITY): Payer: Self-pay

## 2011-12-01 DIAGNOSIS — R05 Cough: Secondary | ICD-10-CM

## 2011-12-01 DIAGNOSIS — J069 Acute upper respiratory infection, unspecified: Secondary | ICD-10-CM

## 2011-12-01 DIAGNOSIS — J40 Bronchitis, not specified as acute or chronic: Secondary | ICD-10-CM

## 2011-12-01 LAB — POCT RAPID STREP A: Streptococcus, Group A Screen (Direct): NEGATIVE

## 2011-12-01 MED ORDER — IBUPROFEN 800 MG PO TABS
ORAL_TABLET | ORAL | Status: AC
  Filled 2011-12-01: qty 1

## 2011-12-01 MED ORDER — GUAIFENESIN-CODEINE 100-10 MG/5ML PO SYRP: 5.0000 mL | ORAL_SOLUTION | Freq: Three times a day (TID) | ORAL | Status: DC | PRN

## 2011-12-01 MED ORDER — SALINE NASAL SPRAY 0.65 % NA SOLN: 1.0000 | NASAL | Status: DC | PRN

## 2011-12-01 MED ORDER — CETIRIZINE-PSEUDOEPHEDRINE ER 5-120 MG PO TB12: 1.0000 | ORAL_TABLET | Freq: Two times a day (BID) | ORAL | Status: DC

## 2011-12-01 MED ORDER — IBUPROFEN 800 MG PO TABS
800.0000 mg | ORAL_TABLET | Freq: Once | ORAL | Status: AC
  Administered 2011-12-01: 800 mg via ORAL

## 2011-12-01 MED ORDER — ALBUTEROL SULFATE (5 MG/ML) 0.5% IN NEBU
2.5000 mg | INHALATION_SOLUTION | Freq: Once | RESPIRATORY_TRACT | Status: AC
  Administered 2011-12-01: 2.5 mg via RESPIRATORY_TRACT

## 2011-12-01 MED ORDER — ALBUTEROL SULFATE HFA 108 (90 BASE) MCG/ACT IN AERS: 2.0000 | INHALATION_SPRAY | RESPIRATORY_TRACT | Status: DC | PRN

## 2011-12-01 MED ORDER — ALBUTEROL SULFATE (5 MG/ML) 0.5% IN NEBU
INHALATION_SOLUTION | RESPIRATORY_TRACT | Status: AC
  Filled 2011-12-01: qty 0.5

## 2011-12-01 NOTE — ED Notes (Signed)
Patient complains of sore throat, cough, headache and body aches x 3 days, states she also had diarrhea and felt warm although she did not take her temperature

## 2011-12-01 NOTE — ED Provider Notes (Signed)
History     CSN: [id number]  Arrival date & time 12/01/11  1324   First MD Initiated Contact with Patient 12/01/11 1434      Chief Complaint  Patient presents with  . Sore Throat    (Consider location/radiation/quality/duration/timing/severity/associated sxs/prior treatment) The history is provided by the patient.   Dayra is a 26 y.o. female who complains of onset of cough for 2 days.  Has taken tylenol for symptoms with no relief.  Husband smokes.  + sore throat + cough, non productive No pleuritic pain No wheezing + nasal congestion + post-nasal drainage + sinus pain/pressure + voice changes No chest congestion No itchy/red eyes No earache No hemoptysis No SOB + chills/sweats No fever No nausea No vomiting No abdominal pain No diarrhea No skin rashes No fatigue + myalgias + headache  No ill contacts  Past Medical History  Diagnosis Date  . Anemia   . Abnormal Pap smear of cervix   . Blood transfusion 2005  . Postpartum hemorrhage 09/2003    Past Surgical History  Procedure Date  . Colposcopy     Family History  Problem Relation Age of Onset  . Diabetes Mother     History  Substance Use Topics  . Smoking status: Never Smoker   . Smokeless tobacco: Not on file  . Alcohol Use: No    OB History    Grav Para Term Preterm Abortions TAB SAB Ect Mult Living   4 4 3 1      4       Review of Systems  All other systems reviewed and are negative.    Allergies  Penicillins and Latex  Home Medications   Current Outpatient Rx  Name Route Sig Dispense Refill  . ALBUTEROL SULFATE HFA 108 (90 BASE) MCG/ACT IN AERS Inhalation Inhale 2 puffs into the lungs every 4 (four) hours as needed for wheezing. 1 Inhaler 0  . CETIRIZINE-PSEUDOEPHEDRINE ER 5-120 MG PO TB12 Oral Take 1 tablet by mouth 2 (two) times daily. 60 tablet 0  . GUAIFENESIN-CODEINE 100-10 MG/5ML PO SYRP Oral Take 5 mLs by mouth 3 (three) times daily as needed for cough. 120 mL 0  .  PRENATAL PLUS 27-1 MG PO TABS Oral Take 1 tablet by mouth daily.     Marland Kitchen SALINE NASAL SPRAY 0.65 % NA SOLN Nasal Place 1 spray into the nose as needed for congestion. 30 mL 12    BP 120/81  Pulse 94  Temp 98.8 F (37.1 C) (Oral)  Resp 16  SpO2 98%  LMP 10/20/2011  Breastfeeding? No  Physical Exam  Nursing note and vitals reviewed. Constitutional: She is oriented to person, place, and time. Vital signs are normal. She appears well-developed and well-nourished. She is active and cooperative.  HENT:  Head: Normocephalic.  Right Ear: Hearing, tympanic membrane, external ear and ear canal normal.  Left Ear: Hearing, tympanic membrane, external ear and ear canal normal.  Nose: Right sinus exhibits maxillary sinus tenderness. Right sinus exhibits no frontal sinus tenderness. Left sinus exhibits maxillary sinus tenderness and frontal sinus tenderness.  Mouth/Throat: Uvula is midline and mucous membranes are normal. Posterior oropharyngeal erythema present. No oropharyngeal exudate or posterior oropharyngeal edema.       Post nasal gtt, cobble stone appearance to posterior pharynx  Eyes: Conjunctivae normal are normal. Pupils are equal, round, and reactive to light. No scleral icterus.  Neck: Trachea normal, normal range of motion and full passive range of motion without pain. Neck supple. No  muscular tenderness present.  Cardiovascular: Normal rate, regular rhythm, normal heart sounds, intact distal pulses and normal pulses.   Pulmonary/Chest: Effort normal and breath sounds normal.  Lymphadenopathy:    She has cervical adenopathy.       Shotty cervical lymph nodes, tender to palpation  Neurological: She is alert and oriented to person, place, and time. No cranial nerve deficit or sensory deficit.  Skin: Skin is warm and dry.  Psychiatric: She has a normal mood and affect. Her speech is normal and behavior is normal. Judgment and thought content normal. Cognition and memory are normal.    ED  Course  Procedures (including critical care time)   Labs Reviewed  POCT RAPID STREP A ([facility] CARE ONLY)   No results found.   1. URI (upper respiratory infection)   2. Bronchitis   3. Cough       MDM  Albuterol neb in office for cough.  Medications as prescribed.       Johnsie Kindred, NP 12/01/11 1519

## 2011-12-01 NOTE — ED Provider Notes (Signed)
Medical screening examination/treatment/procedure(s) were performed by resident physician or non-physician practitioner and as supervising physician I was immediately available for consultation/collaboration.   Brenya Taulbee DOUGLAS MD.    Melia Hopes D Inge Waldroup, MD 12/01/11 2120 

## 2012-05-27 ENCOUNTER — Encounter (HOSPITAL_COMMUNITY): Payer: Self-pay | Admitting: *Deleted

## 2012-05-27 ENCOUNTER — Emergency Department (INDEPENDENT_AMBULATORY_CARE_PROVIDER_SITE_OTHER)
Admission: EM | Admit: 2012-05-27 | Discharge: 2012-05-27 | Disposition: A | Discharge status: Home / Self Care | Payer: Medicaid Other | Source: Home / Self Care | Attending: Family Medicine | Admitting: Family Medicine

## 2012-05-27 DIAGNOSIS — Z331 Pregnant state, incidental: Secondary | ICD-10-CM

## 2012-05-27 DIAGNOSIS — L42 Pityriasis rosea: Secondary | ICD-10-CM

## 2012-05-27 LAB — POCT URINALYSIS DIP (DEVICE)
Bilirubin Urine: NEGATIVE
Glucose, UA: 100 mg/dL — AB
Ketones, ur: NEGATIVE mg/dL
Specific Gravity, Urine: 1.015 (ref 1.005–1.030)
Urobilinogen, UA: 0.2 mg/dL (ref 0.0–1.0)

## 2012-05-27 LAB — POCT PREGNANCY, URINE: Preg Test, Ur: POSITIVE — AB

## 2012-05-27 NOTE — ED Provider Notes (Addendum)
History     CSN: [id number]  Arrival date & time 05/27/12  1757   First MD Initiated Contact with Patient 05/27/12 1801      Chief Complaint  Patient presents with  . Rash    (Consider location/radiation/quality/duration/timing/severity/associated sxs/prior treatment) Patient is a 27 y.o. female presenting with rash. The history is provided by the patient.  Rash Location:  Full body Quality: itchiness and redness   Severity:  Mild Onset quality:  Gradual Duration:  2 days Chronicity:  New Associated symptoms: abdominal pain     Past Medical History  Diagnosis Date  . Anemia   . Abnormal Pap smear of cervix   . Blood transfusion 2005  . Postpartum hemorrhage 09/2003    Past Surgical History  Procedure Laterality Date  . Colposcopy      Family History  Problem Relation Age of Onset  . Diabetes Mother     History  Substance Use Topics  . Smoking status: Never Smoker   . Smokeless tobacco: Not on file  . Alcohol Use: No    OB History   Grav Para Term Preterm Abortions TAB SAB Ect Mult Living   4 4 3 1      4       Review of Systems  Constitutional: Negative.   HENT: Negative.   Gastrointestinal: Positive for abdominal pain.  Genitourinary: Positive for dysuria, urgency and frequency.  Skin: Positive for rash.    Allergies  Penicillins and Latex  Home Medications   Current Outpatient Rx  Name  Route  Sig  Dispense  Refill  . albuterol (PROVENTIL HFA;VENTOLIN HFA) 108 (90 BASE) MCG/ACT inhaler   Inhalation   Inhale 2 puffs into the lungs every 4 (four) hours as needed for wheezing.   1 Inhaler   0   . cetirizine-pseudoephedrine (ZYRTEC-D) 5-120 MG per tablet   Oral   Take 1 tablet by mouth 2 (two) times daily.   60 tablet   0   . guaiFENesin-codeine (ROBITUSSIN AC) 100-10 MG/5ML syrup   Oral   Take 5 mLs by mouth 3 (three) times daily as needed for cough.   120 mL   0   . prenatal vitamin w/FE, FA (PRENATAL 1 + 1) 27-1 MG TABS    Oral   Take 1 tablet by mouth daily.          . sodium chloride (OCEAN NASAL SPRAY) 0.65 % nasal spray   Nasal   Place 1 spray into the nose as needed for congestion.   30 mL   12     BP 125/63  Pulse 92  Temp(Src) 98.1 F (36.7 C) (Oral)  Resp 12  SpO2 99%  Physical Exam  Nursing note and vitals reviewed. Constitutional: She is oriented to person, place, and time. She appears well-developed and well-nourished.  Abdominal: Soft. Bowel sounds are normal.  Neurological: She is alert and oriented to person, place, and time.  Skin: Skin is warm and dry. Rash noted.  Diffuse oval erythematous patchy rash,sl scale on lesions.    ED Course  Procedures (including critical care time)  Labs Reviewed  POCT URINALYSIS DIP (DEVICE) - Abnormal; Notable for the following:    Glucose, UA 100 (*)    All other components within normal limits  POCT PREGNANCY, URINE - Abnormal; Notable for the following:    Preg Test, Ur POSITIVE (*)    All other components within normal limits   No results found.   1. Pityriasis rosea  2. Pregnancy as incidental finding       MDM  U/a neg. upreg pos.        Linna Hoff, MD 05/27/12 Windell Moment  Linna Hoff, MD 05/27/12 [phone]

## 2012-05-27 NOTE — ED Notes (Signed)
Pt  Has  Symptoms  Of a  Fine  Rash    That  Developed  3  Days  Ago    Itches  -  No  New  meds   No  Known  Causative  Agents           No  Angioedema     -    She  Also  Reports  Symptoms  Of  Frequent  Urination   And     abd  And  Epigastric  Pain   abd  Is  Soft    She  denys    Any  Vaginal  Discharge or  Bleeding

## 2012-06-29 ENCOUNTER — Encounter (HOSPITAL_COMMUNITY): Payer: Self-pay | Admitting: *Deleted

## 2012-06-29 ENCOUNTER — Inpatient Hospital Stay (HOSPITAL_COMMUNITY): Payer: Medicaid Other

## 2012-06-29 ENCOUNTER — Inpatient Hospital Stay (HOSPITAL_COMMUNITY)
Admission: AD | Admit: 2012-06-29 | Discharge: 2012-06-30 | Disposition: A | Discharge status: Home / Self Care | Payer: Medicaid Other | Source: Ambulatory Visit | Attending: Obstetrics & Gynecology | Admitting: Obstetrics & Gynecology

## 2012-06-29 DIAGNOSIS — R109 Unspecified abdominal pain: Secondary | ICD-10-CM | POA: Insufficient documentation

## 2012-06-29 DIAGNOSIS — N760 Acute vaginitis: Secondary | ICD-10-CM

## 2012-06-29 DIAGNOSIS — O021 Missed abortion: Secondary | ICD-10-CM

## 2012-06-29 DIAGNOSIS — O035 Genital tract and pelvic infection following complete or unspecified spontaneous abortion: Secondary | ICD-10-CM | POA: Insufficient documentation

## 2012-06-29 DIAGNOSIS — N949 Unspecified condition associated with female genital organs and menstrual cycle: Secondary | ICD-10-CM | POA: Insufficient documentation

## 2012-06-29 LAB — URINALYSIS, ROUTINE W REFLEX MICROSCOPIC
Ketones, ur: NEGATIVE mg/dL
Leukocytes, UA: NEGATIVE
Nitrite: NEGATIVE
Specific Gravity, Urine: 1.015 (ref 1.005–1.030)
pH: 8 (ref 5.0–8.0)

## 2012-06-29 LAB — CBC
MCH: 25.2 pg — ABNORMAL LOW (ref 26.0–34.0)
Platelets: 342 10*3/uL (ref 150–400)
RBC: 4.33 MIL/uL (ref 3.87–5.11)

## 2012-06-29 LAB — COMPREHENSIVE METABOLIC PANEL
ALT: 14 U/L (ref 0–35)
AST: 20 U/L (ref 0–37)
Albumin: 3.7 g/dL (ref 3.5–5.2)
CO2: 28 mEq/L (ref 19–32)
Calcium: 9.6 mg/dL (ref 8.4–10.5)
GFR calc non Af Amer: >90 mL/min (ref >90)
Sodium: 137 mEq/L (ref 135–145)
Total Protein: 7.6 g/dL (ref 6.0–8.3)

## 2012-06-29 LAB — WET PREP, GENITAL

## 2012-06-29 MED ORDER — KETOROLAC TROMETHAMINE 60 MG/2ML IM SOLN
60.0000 mg | Freq: Once | INTRAMUSCULAR | Status: AC
  Administered 2012-06-29: 60 mg via INTRAMUSCULAR
  Filled 2012-06-29: qty 2

## 2012-06-29 MED ORDER — GI COCKTAIL ~~LOC~~
30.0000 mL | Freq: Once | ORAL | Status: AC
  Administered 2012-06-29: 30 mL via ORAL
  Filled 2012-06-29: qty 30

## 2012-06-29 NOTE — MAU Provider Note (Signed)
CC: Abdominal Pain    First Provider Initiated Contact with Patient 06/29/12 2027      HPI Patricia Macdonald is a 27 y.o. [id number]  who presents with onset of periumbilical sharp stabbing abdominal pains intermittently for the past few days. About 4 days ago she had an episode while at work of burning substernal chest pain severe enough to make her cry, but has not had further chest pain. Appetite is decreased. Denies N/V/C/D, fever, food intolerances. Not exacerbated by eating. Denies increased stress. No previous similar episodes. No home treatment.  LNMP in Feb. Seen at [facility] 05/27/12 with incidental positive UPT and thinks she had a miscariage 2 wks when she had bleeding heavier than a normal period. Plans to get Mirena soon. Has abnormal appearing vaginal discharge "like white blood" that is not irritative.   Past Medical History  Diagnosis Date  . Anemia   . Abnormal Pap smear of cervix   . Blood transfusion 2005  . Postpartum hemorrhage 09/2003    OB History   Grav Para Term Preterm Abortions TAB SAB Ect Mult Living   5 4 3 1 1  1   4      # Outc Date GA Lbr Len/2nd Wgt Sex Del Anes PTL Lv   1 TRM 12/12 [redacted]w[redacted]d 00:00 8lb6.8oz(3.82kg) M SVD None  Yes   Comments: WDL   2 TRM            3 TRM            4 PRE            5 SAB               Past Surgical History  Procedure Laterality Date  . Colposcopy      History   Social History  . Marital Status: Married    Spouse Name: N/A    Number of Children: N/A  . Years of Education: N/A   Occupational History  . Not on file.   Social History Main Topics  . Smoking status: Never Smoker   . Smokeless tobacco: Not on file  . Alcohol Use: No  . Drug Use: No  . Sexually Active: Yes    Birth Control/ Protection: IUD   Other Topics Concern  . Not on file   Social History Narrative  . No narrative on file    No current facility-administered medications on file prior to encounter.   No current outpatient prescriptions  on file prior to encounter.    Allergies  Allergen Reactions  . Penicillins Anaphylaxis  . Latex Hives and Swelling    ROS Pertinent items in HPI  PHYSICAL EXAM Filed Vitals:   06/29/12 2040  BP: 120/76  Pulse: 94  Temp:   Resp: 18   General: Well nourished, well developed female in no acute distress Cardiovascular: Normal rate Respiratory: Normal effort Abdomen: Soft, nontender Back: No CVAT Extremities: No edema Neurologic: Alert and oriented Speculum exam: NEFG; vagina with mucusy discharge, no blood; cervix clean Bimanual exam: cervix closed, no CMT; uterus NSSP; no adnexal tenderness or masses  LAB RESULTS Results for orders placed during the hospital encounter of 06/29/12 (from the past 24 hour(s))  URINALYSIS, ROUTINE W REFLEX MICROSCOPIC     Status: None   Collection Time    06/29/12  8:20 PM      Result Value Range   Color, Urine YELLOW  YELLOW   APPearance CLEAR  CLEAR   Specific Gravity, Urine 1.015  1.005 - 1.030   pH 8.0  5.0 - 8.0   Glucose, UA NEGATIVE  NEGATIVE mg/dL   Hgb urine dipstick NEGATIVE  NEGATIVE   Bilirubin Urine NEGATIVE  NEGATIVE   Ketones, ur NEGATIVE  NEGATIVE mg/dL   Protein, ur NEGATIVE  NEGATIVE mg/dL   Urobilinogen, UA 1.0  0.0 - 1.0 mg/dL   Nitrite NEGATIVE  NEGATIVE   Leukocytes, UA NEGATIVE  NEGATIVE  POCT PREGNANCY, URINE     Status: None   Collection Time    06/29/12  8:31 PM      Result Value Range   Preg Test, Ur NEGATIVE  NEGATIVE  WET PREP, GENITAL     Status: Abnormal   Collection Time    06/29/12  8:35 PM      Result Value Range   Yeast Wet Prep HPF POC NONE SEEN  NONE SEEN   Trich, Wet Prep NONE SEEN  NONE SEEN   Clue Cells Wet Prep HPF POC FEW (*) NONE SEEN   WBC, Wet Prep HPF POC FEW (*) NONE SEEN  CBC     Status: Abnormal   Collection Time    06/29/12  8:55 PM      Result Value Range   WBC 5.9  4.0 - 10.5 K/uL   RBC 4.33  3.87 - 5.11 MIL/uL   Hemoglobin 10.9 (*) 12.0 - 15.0 g/dL   HCT 81.1 (*) 91.4 -  46.0 %   MCV 78.1  78.0 - 100.0 fL   MCH 25.2 (*) 26.0 - 34.0 pg   MCHC 32.2  30.0 - 36.0 g/dL   RDW 78.2 (*) 95.6 - 21.3 %   Platelets 342  150 - 400 K/uL  COMPREHENSIVE METABOLIC PANEL     Status: Abnormal   Collection Time    06/29/12  8:55 PM      Result Value Range   Sodium 137  135 - 145 mEq/L   Potassium 4.2  3.5 - 5.1 mEq/L   Chloride 101  96 - 112 mEq/L   CO2 28  19 - 32 mEq/L   Glucose, Bld 99  70 - 99 mg/dL   BUN 15  6 - 23 mg/dL   Creatinine, Ser 0.86  0.50 - 1.10 mg/dL   Calcium 9.6  8.4 - 57.8 mg/dL   Total Protein 7.6  6.0 - 8.3 g/dL   Albumin 3.7  3.5 - 5.2 g/dL   AST 20  0 - 37 U/L   ALT 14  0 - 35 U/L   Alkaline Phosphatase 105  39 - 117 U/L   Total Bilirubin 0.2 (*) 0.3 - 1.2 mg/dL   GFR calc non Af Amer >90  >90 mL/min   GFR calc Af Amer >90  >90 mL/min   MAU COURSE Care assumed by [place] CNM at 2105.  2250: No pain relief w/ GI cocktail. Pain is LLQ, sharp, intermittent, 7/10 on pain scale. Requesting pain meds. Toradol given. Will order pelvic US. Clarifies that she had vaginal bleeding 06/12/12-06/26/12, heavier and longer than normal periods.   0120: No pain relief w/ Toradol. Percocet ordered. US shows possible retained POC. Discussed [place] Sx, plans for IUD w/ Dr. Macon Large. In absence of fever, bleeding, uterine tenderness medical or surgical Tx of retained POC not necessary, but can be offered. Pt would like to try cytotec to see if evacuation of contents of uterus leads to resolution of pain and because she is concerned that she may have to come back  for pain, possible infection. Informed her that pain may or may not be related. Cytotec 800 mcg given PV in MAU per pt choice.  Pt state she has reliable transportation. Risks of bleeding and failure of method reviewed.   IMAGING US Transvaginal Non-ob  06/29/2012  *RADIOLOGY REPORT*  Clinical Data: Left lower quadrant pain.  History of positive urine pregnancy test early in a fall with negative urine  pregnancy test today.  Possible spontaneous abortion.  TRANSABDOMINAL AND TRANSVAGINAL ULTRASOUND OF PELVIS Technique:  Both transabdominal and transvaginal ultrasound examinations of the pelvis were performed. Transabdominal technique was performed for global imaging of the pelvis including uterus, ovaries, adnexal regions, and pelvic cul-de-sac.  It was necessary to proceed with endovaginal exam following the transabdominal exam to visualize the endometrium and ovaries.  Comparison:  None  Findings:  Uterus: The uterus appears retroverted and measures about 8.6 x 5.6 x 6.9 cm.  No abnormal myometrial mass lesions identified. Tiny Nabothian cystin the cervix.  Endometrium: Endometrial stripe thickness is normal by measurement 11 mm.  However, the endometrial stripe appears heterogeneous with focal hyperechoic thickening in the fundal region.  Hyperechoic material demonstrates increased blood flow.  Findings suggest retained products of conception.  No abnormal endometrial fluid collections.  Right ovary:  The right ovary measures 2.7 x 2 x 1.6 cm.  Normal follicular changes.  Flow is demonstrated in the right ovary on color flow Doppler imaging.  Left ovary: The left ovary measures 3.4 x 1.8 x 1.8 cm.  The normal follicular changes.  Flow is demonstrated left ovary on color flow Doppler imaging.  Other findings: No free fluid  IMPRESSION: Heterogeneous echogenic appearance of the endometrium with blood flow demonstrated on color flow Doppler imaging suggesting retained products of conception.  Ovaries appear normal without evidence of abnormal adnexal mass.  No free pelvic fluid collection.   Original Report Authenticated By: Burman Nieves, M.D.    US Pelvis Complete  06/29/2012  *RADIOLOGY REPORT*  Clinical Data: Left lower quadrant pain.  History of positive urine pregnancy test early in a fall with negative urine pregnancy test today.  Possible spontaneous abortion.  TRANSABDOMINAL AND TRANSVAGINAL  ULTRASOUND OF PELVIS Technique:  Both transabdominal and transvaginal ultrasound examinations of the pelvis were performed. Transabdominal technique was performed for global imaging of the pelvis including uterus, ovaries, adnexal regions, and pelvic cul-de-sac.  It was necessary to proceed with endovaginal exam following the transabdominal exam to visualize the endometrium and ovaries.  Comparison:  None  Findings:  Uterus: The uterus appears retroverted and measures about 8.6 x 5.6 x 6.9 cm.  No abnormal myometrial mass lesions identified. Tiny Nabothian cystin the cervix.  Endometrium: Endometrial stripe thickness is normal by measurement 11 mm.  However, the endometrial stripe appears heterogeneous with focal hyperechoic thickening in the fundal region.  Hyperechoic material demonstrates increased blood flow.  Findings suggest retained products of conception.  No abnormal endometrial fluid collections.  Right ovary:  The right ovary measures 2.7 x 2 x 1.6 cm.  Normal follicular changes.  Flow is demonstrated in the right ovary on color flow Doppler imaging.  Left ovary: The left ovary measures 3.4 x 1.8 x 1.8 cm.  The normal follicular changes.  Flow is demonstrated left ovary on color flow Doppler imaging.  Other findings: No free fluid  IMPRESSION: Heterogeneous echogenic appearance of the endometrium with blood flow demonstrated on color flow Doppler imaging suggesting retained products of conception.  Ovaries appear normal without evidence of abnormal  adnexal mass.  No free pelvic fluid collection.   Original Report Authenticated By: Burman Nieves, M.D.       ASSESSMENT 1. Retained products of conception, early pregnancy   2. BV (bacterial vaginosis)    PLAN Discharge home.  Bleeding, infection precautions.    Medication List    TAKE these medications       Biotin 1000 MCG tablet  Take 1,000 mcg by mouth daily.     ibuprofen 200 MG tablet  Commonly known as:  ADVIL,MOTRIN  Take 3  tablets (600 mg total) by mouth every 6 (six) hours as needed for pain.     metroNIDAZOLE 500 MG tablet  Commonly known as:  FLAGYL  Take 1 tablet (500 mg total) by mouth 2 (two) times daily.     oxyCODONE-acetaminophen 5-325 MG per tablet  Commonly known as:  ROXICET  Take 1-2 tablets by mouth every 6 (six) hours as needed for pain.     promethazine 25 MG tablet  Commonly known as:  PHENERGAN  Take 0.5-1 tablets (12.5-25 mg total) by mouth every 6 (six) hours as needed for nausea.       Follow-up Information   Follow up with Mickel Baas, MD In 2 weeks.   Contact information:   [address] [place] [address] [phone]       Follow up with [facility] [place] [facility]ERN[facility] ADMISSIONS. (As needed if symptoms worsen)    Contact information:   [address] [place] [address] [phone]     Danae Orleans, CNM 06/29/2012 8:50 PM   Dorathy Kinsman, CNM 06/30/2012 1:49 AM

## 2012-06-29 NOTE — MAU Note (Signed)
C/o sharp intermittent contraction-like pain on the L side of her bellybutton since yesterday;

## 2012-06-30 ENCOUNTER — Telehealth: Payer: Self-pay | Admitting: Advanced Practice Midwife

## 2012-06-30 ENCOUNTER — Encounter (HOSPITAL_COMMUNITY): Payer: Self-pay | Admitting: Advanced Practice Midwife

## 2012-06-30 DIAGNOSIS — O021 Missed abortion: Secondary | ICD-10-CM

## 2012-06-30 LAB — GC/CHLAMYDIA PROBE AMP
CT Probe RNA: NEGATIVE
GC Probe RNA: NEGATIVE

## 2012-06-30 MED ORDER — METRONIDAZOLE 500 MG PO TABS: 500.0000 mg | ORAL_TABLET | Freq: Two times a day (BID) | ORAL | Status: DC

## 2012-06-30 MED ORDER — OXYCODONE-ACETAMINOPHEN 5-325 MG PO TABS: 1.0000 | ORAL_TABLET | Freq: Four times a day (QID) | ORAL | Status: DC | PRN

## 2012-06-30 MED ORDER — IBUPROFEN 200 MG PO TABS: 600.0000 mg | ORAL_TABLET | Freq: Four times a day (QID) | ORAL | Status: DC | PRN

## 2012-06-30 MED ORDER — MISOPROSTOL 200 MCG PO TABS
800.0000 ug | ORAL_TABLET | Freq: Once | ORAL | Status: AC
  Administered 2012-06-30: 800 ug via VAGINAL
  Filled 2012-06-30: qty 4

## 2012-06-30 MED ORDER — HYDROCODONE-ACETAMINOPHEN 5-325 MG PO TABS: 1.0000 | ORAL_TABLET | Freq: Four times a day (QID) | ORAL | Status: DC | PRN

## 2012-06-30 MED ORDER — OXYCODONE-ACETAMINOPHEN 5-325 MG PO TABS
2.0000 | ORAL_TABLET | Freq: Once | ORAL | Status: AC
  Administered 2012-06-30: 2 via ORAL
  Filled 2012-06-30: qty 2

## 2012-06-30 MED ORDER — PROMETHAZINE HCL 25 MG PO TABS: 12.5000 mg | ORAL_TABLET | Freq: Four times a day (QID) | ORAL | Status: DC | PRN

## 2012-06-30 NOTE — Telephone Encounter (Signed)
Rx Percocet yesterday for retained POC Tx w/ Cytotec. Requesting different pain med due to N/V. Has taken Lortab in past w/out problem. Called in Vicodin.

## 2013-02-02 ENCOUNTER — Emergency Department (HOSPITAL_COMMUNITY)
Admission: EM | Admit: 2013-02-02 | Discharge: 2013-02-03 | Disposition: A | Discharge status: Home / Self Care | Payer: Medicaid Other | Attending: Emergency Medicine | Admitting: Emergency Medicine

## 2013-02-02 ENCOUNTER — Encounter (HOSPITAL_COMMUNITY): Payer: Self-pay | Admitting: Emergency Medicine

## 2013-02-02 DIAGNOSIS — R51 Headache: Secondary | ICD-10-CM | POA: Insufficient documentation

## 2013-02-02 DIAGNOSIS — R630 Anorexia: Secondary | ICD-10-CM | POA: Insufficient documentation

## 2013-02-02 DIAGNOSIS — Z3201 Encounter for pregnancy test, result positive: Secondary | ICD-10-CM | POA: Insufficient documentation

## 2013-02-02 DIAGNOSIS — Z3490 Encounter for supervision of normal pregnancy, unspecified, unspecified trimester: Secondary | ICD-10-CM

## 2013-02-02 DIAGNOSIS — R635 Abnormal weight gain: Secondary | ICD-10-CM | POA: Insufficient documentation

## 2013-02-02 DIAGNOSIS — F3289 Other specified depressive episodes: Secondary | ICD-10-CM | POA: Insufficient documentation

## 2013-02-02 DIAGNOSIS — R519 Headache, unspecified: Secondary | ICD-10-CM

## 2013-02-02 DIAGNOSIS — R5381 Other malaise: Secondary | ICD-10-CM | POA: Insufficient documentation

## 2013-02-02 DIAGNOSIS — F329 Major depressive disorder, single episode, unspecified: Secondary | ICD-10-CM | POA: Insufficient documentation

## 2013-02-02 DIAGNOSIS — R110 Nausea: Secondary | ICD-10-CM | POA: Insufficient documentation

## 2013-02-02 DIAGNOSIS — Z862 Personal history of diseases of the blood and blood-forming organs and certain disorders involving the immune mechanism: Secondary | ICD-10-CM | POA: Insufficient documentation

## 2013-02-02 DIAGNOSIS — Z880 Allergy status to penicillin: Secondary | ICD-10-CM | POA: Insufficient documentation

## 2013-02-02 DIAGNOSIS — Z91040 Latex allergy status: Secondary | ICD-10-CM | POA: Insufficient documentation

## 2013-02-02 NOTE — ED Notes (Signed)
Pt in c/o migraines over the last month and generalized weakness, states the pain is constant and today she has been feeling shaky, pt with history of headaches but not like this.

## 2013-02-03 ENCOUNTER — Emergency Department (HOSPITAL_COMMUNITY): Payer: Medicaid Other

## 2013-02-03 LAB — CBC WITH DIFFERENTIAL/PLATELET
Basophils Absolute: 0.1 10*3/uL (ref 0.0–0.1)
Basophils Relative: 1 % (ref 0–1)
Eosinophils Relative: 5 % (ref 0–5)
HCT: 33.5 % — ABNORMAL LOW (ref 36.0–46.0)
MCHC: 31.6 g/dL (ref 30.0–36.0)
MCV: 76.7 fL — ABNORMAL LOW (ref 78.0–100.0)
Monocytes Absolute: 0.6 10*3/uL (ref 0.1–1.0)
Neutro Abs: 3.9 10*3/uL (ref 1.7–7.7)
Platelets: 315 10*3/uL (ref 150–400)
RDW: 16.1 % — ABNORMAL HIGH (ref 11.5–15.5)

## 2013-02-03 LAB — BASIC METABOLIC PANEL
BUN: 15 mg/dL (ref 6–23)
Calcium: 8.8 mg/dL (ref 8.4–10.5)
Chloride: 102 mEq/L (ref 96–112)
Creatinine, Ser: 0.84 mg/dL (ref 0.50–1.10)
GFR calc Af Amer: >90 mL/min (ref >90)

## 2013-02-03 LAB — URINALYSIS, ROUTINE W REFLEX MICROSCOPIC
Bilirubin Urine: NEGATIVE
Ketones, ur: NEGATIVE mg/dL
Nitrite: NEGATIVE
Protein, ur: NEGATIVE mg/dL
pH: 7 (ref 5.0–8.0)

## 2013-02-03 MED ORDER — BUTALBITAL-APAP-CAFFEINE 50-325-40 MG PO TABS: 1.0000 | ORAL_TABLET | Freq: Four times a day (QID) | ORAL | Status: DC | PRN

## 2013-02-03 MED ORDER — ONDANSETRON 8 MG PO TBDP: 8.0000 mg | ORAL_TABLET | Freq: Three times a day (TID) | ORAL | Status: DC | PRN

## 2013-02-03 MED ORDER — ACETAMINOPHEN 325 MG PO TABS
650.0000 mg | ORAL_TABLET | Freq: Once | ORAL | Status: AC
  Administered 2013-02-03: 650 mg via ORAL
  Filled 2013-02-03: qty 2

## 2013-02-03 MED ORDER — METOCLOPRAMIDE HCL 5 MG/ML IJ SOLN
10.0000 mg | Freq: Once | INTRAMUSCULAR | Status: DC
  Filled 2013-02-03: qty 2

## 2013-02-03 MED ORDER — DIPHENHYDRAMINE HCL 50 MG/ML IJ SOLN
25.0000 mg | Freq: Once | INTRAMUSCULAR | Status: DC
  Filled 2013-02-03: qty 1

## 2013-02-03 NOTE — ED Provider Notes (Signed)
CSN: [id number]     Arrival date & time 02/02/13  2046 History   First MD Initiated Contact with Patient 02/02/13 2300     Chief Complaint  Patient presents with  . Migraine  . Weakness   (Consider location/radiation/quality/duration/timing/severity/associated sxs/prior Treatment) HPI 27 year old female presents emergency apartment with complaint of headache intermittently for the last month, generalized weakness, decreased appetite, weight gain, mild nausea.  Patient felt like she was going to pass out.  Today, felt shaky.  She denies previous history of same.  Last menstrual cycle, a been a month ago, she thinks she may be due in the next 9 days.  She denies any breast tenderness.  No vaginal discharge or urinary symptoms.  She reports history of anemia, no longer on iron for this.  Denies family history of migraines.  Patient, reports she's concerned as one of her husbands friends died of an aneurysm, and she is afraid that she may have an aneurysm.  Headache is dull, frontal.  Associated with photophobia.  Sometimes the pain is in the back of the head.  No thunderclap onset, headache is better with sleep, but then returns.  Headache is not maximal at onset.  Past Medical History  Diagnosis Date  . Anemia   . Abnormal Pap smear of cervix   . Blood transfusion 2005  . Postpartum hemorrhage 09/2003   Past Surgical History  Procedure Laterality Date  . Colposcopy     Family History  Problem Relation Age of Onset  . Diabetes Mother    History  Substance Use Topics  . Smoking status: Never Smoker   . Smokeless tobacco: Not on file  . Alcohol Use: No   OB History   Grav Para Term Preterm Abortions TAB SAB Ect Mult Living   5 4 4  0 1  1   4      Review of Systems  All other systems reviewed and are negative.    Allergies  Penicillins; Latex; and Percocet  Home Medications   Current Outpatient Rx  Name  Route  Sig  Dispense  Refill  . ibuprofen (ADVIL,MOTRIN) 200 MG  tablet   Oral   Take 3 tablets (600 mg total) by mouth every 6 (six) hours as needed for pain.   30 tablet       BP 132/67  Pulse 85  Temp(Src) 99.3 F (37.4 C) (Oral)  Resp 18  SpO2 98% Physical Exam  Constitutional: She is oriented to person, place, and time. She appears well-developed and well-nourished.  HENT:  Head: Normocephalic and atraumatic.  Right Ear: External ear normal.  Left Ear: External ear normal.  Nose: Nose normal.  Mouth/Throat: Oropharynx is clear and moist.  Eyes: Conjunctivae and EOM are normal. Pupils are equal, round, and reactive to light.  Neck: Normal range of motion. Neck supple. No JVD present. No tracheal deviation present. No thyromegaly present.  Cardiovascular: Normal rate, regular rhythm, normal heart sounds and intact distal pulses.  Exam reveals no gallop and no friction rub.   No murmur heard. Pulmonary/Chest: Effort normal and breath sounds normal. No stridor. No respiratory distress. She has no wheezes. She has no rales. She exhibits no tenderness.  Abdominal: Soft. Bowel sounds are normal. She exhibits no distension and no mass. There is no tenderness. There is no rebound and no guarding.  Musculoskeletal: Normal range of motion. She exhibits no edema and no tenderness.  Lymphadenopathy:    She has no cervical adenopathy.  Neurological: She is alert  and oriented to person, place, and time. She has normal reflexes. No cranial nerve deficit. She exhibits normal muscle tone. Coordination normal.  Skin: Skin is dry. No rash noted. No erythema. No pallor.  Psychiatric: She has a normal mood and affect. Her behavior is normal. Judgment and thought content normal.  Flat affect, soft voice, depressed mood    ED Course  Procedures (including critical care time) Labs Review Labs Reviewed  URINALYSIS, ROUTINE W REFLEX MICROSCOPIC - Abnormal; Notable for the following:    Glucose, UA 250 (*)    All other components within normal limits  BASIC  METABOLIC PANEL - Abnormal; Notable for the following:    Sodium 134 (*)    All other components within normal limits  CBC WITH DIFFERENTIAL - Abnormal; Notable for the following:    Hemoglobin 10.6 (*)    HCT 33.5 (*)    MCV 76.7 (*)    MCH 24.3 (*)    RDW 16.1 (*)    All other components within normal limits  POCT PREGNANCY, URINE - Abnormal; Notable for the following:    Preg Test, Ur POSITIVE (*)    All other components within normal limits   Imaging Review No results found.  EKG Interpretation   None       MDM   1. Headache   2. Pregnancy    27 year old female with intermittent headaches for a week.  Discussed with patient, no specific need for CAT scan at this time, especially given pregnancy, results.  Encourage patient to followup with her OB/GYN.  Symptoms of fatigue, sleep difficulties, weakness, maybe due to pregnancy.  Patient refuses headache cocktail.  Will have her followup with neurology for further workup of her headache with possible outpatient MRI if indicated.    Olivia Mackie, MD 02/03/13 [phone]

## 2013-02-03 NOTE — ED Notes (Signed)
Pt refusing medications. EDP made aware.

## 2013-02-09 ENCOUNTER — Emergency Department (HOSPITAL_COMMUNITY)
Admission: EM | Admit: 2013-02-09 | Discharge: 2013-02-10 | Disposition: A | Discharge status: Home / Self Care | Payer: Medicaid Other | Attending: Emergency Medicine | Admitting: Emergency Medicine

## 2013-02-09 ENCOUNTER — Encounter (HOSPITAL_COMMUNITY): Payer: Self-pay | Admitting: Emergency Medicine

## 2013-02-09 DIAGNOSIS — O9989 Other specified diseases and conditions complicating pregnancy, childbirth and the puerperium: Secondary | ICD-10-CM | POA: Insufficient documentation

## 2013-02-09 DIAGNOSIS — Z862 Personal history of diseases of the blood and blood-forming organs and certain disorders involving the immune mechanism: Secondary | ICD-10-CM | POA: Insufficient documentation

## 2013-02-09 DIAGNOSIS — Z880 Allergy status to penicillin: Secondary | ICD-10-CM | POA: Insufficient documentation

## 2013-02-09 DIAGNOSIS — J329 Chronic sinusitis, unspecified: Secondary | ICD-10-CM | POA: Insufficient documentation

## 2013-02-09 DIAGNOSIS — Z91040 Latex allergy status: Secondary | ICD-10-CM | POA: Insufficient documentation

## 2013-02-09 DIAGNOSIS — R5381 Other malaise: Secondary | ICD-10-CM | POA: Insufficient documentation

## 2013-02-09 DIAGNOSIS — F411 Generalized anxiety disorder: Secondary | ICD-10-CM | POA: Insufficient documentation

## 2013-02-09 DIAGNOSIS — R42 Dizziness and giddiness: Secondary | ICD-10-CM | POA: Insufficient documentation

## 2013-02-09 DIAGNOSIS — O99340 Other mental disorders complicating pregnancy, unspecified trimester: Secondary | ICD-10-CM | POA: Insufficient documentation

## 2013-02-09 DIAGNOSIS — H53149 Visual discomfort, unspecified: Secondary | ICD-10-CM | POA: Insufficient documentation

## 2013-02-09 DIAGNOSIS — R51 Headache: Secondary | ICD-10-CM | POA: Insufficient documentation

## 2013-02-09 LAB — CBC WITH DIFFERENTIAL/PLATELET
Basophils Absolute: 0 10*3/uL (ref 0.0–0.1)
Basophils Relative: 1 % (ref 0–1)
Eosinophils Absolute: 0.3 10*3/uL (ref 0.0–0.7)
MCH: 24.4 pg — ABNORMAL LOW (ref 26.0–34.0)
MCHC: 32 g/dL (ref 30.0–36.0)
Neutrophils Relative %: 61 % (ref 43–77)
Platelets: 323 10*3/uL (ref 150–400)

## 2013-02-09 LAB — BASIC METABOLIC PANEL
BUN: 8 mg/dL (ref 6–23)
GFR calc Af Amer: >90 mL/min (ref >90)
GFR calc non Af Amer: >90 mL/min (ref >90)
Potassium: 3.5 mEq/L (ref 3.5–5.1)
Sodium: 135 mEq/L (ref 135–145)

## 2013-02-09 MED ORDER — ACETAMINOPHEN 325 MG PO TABS
650.0000 mg | ORAL_TABLET | Freq: Once | ORAL | Status: AC
  Administered 2013-02-09: 650 mg via ORAL
  Filled 2013-02-09: qty 2

## 2013-02-09 MED ORDER — METOCLOPRAMIDE HCL 10 MG PO TABS
10.0000 mg | ORAL_TABLET | ORAL | Status: AC
  Administered 2013-02-10: 10 mg via ORAL
  Filled 2013-02-09: qty 1

## 2013-02-09 NOTE — ED Notes (Signed)
Pt reported to me that "I am not pregnant anymore. I had an abortion two days ago."

## 2013-02-09 NOTE — ED Notes (Addendum)
Presents with generalized weakness, reports she is [redacted] weeks pregnant, G5 p4, denies dizziness or blurred vision, denies recent illness. She states, "I feel real dehydrated. I threw up all last night, I don't have any pain in my stomach, just some pain in my head. It might be my blood sugar." alert, oriented, no drift or droop.  Weakness began at 5 pm and headache has gradually started today and has worsened.  Reports inabiilty to sleep because of frequent headaches and waking up often. Pt is falling asleep during triage process but easily arousable.

## 2013-02-09 NOTE — ED Provider Notes (Signed)
CSN: [id number]     Arrival date & time 02/09/13  1914 History   First MD Initiated Contact with Patient 02/09/13 2327     Chief Complaint  Patient presents with  . Weakness   (Consider location/radiation/quality/duration/timing/severity/associated sxs/prior Treatment) HPI Patient presents with frontal headache she states has lasted for "weeks". It is gradual onset and constant. She states the pain is worse when she leans forward. She's had nasal congestion and some rhinorrhea. She has a previous history of sinus infections. She denies any fevers or chills. She denies any head trauma. She denies any focal weakness or numbness. She has mild photophobia but denies any nausea or vomiting. Patient states that she had an elective abortion 2 days ago. She is currently having no vaginal bleeding or discharge. She denies any abdominal pain. Past Medical History  Diagnosis Date  . Anemia   . Abnormal Pap smear of cervix   . Blood transfusion 2005  . Postpartum hemorrhage 09/2003   Past Surgical History  Procedure Laterality Date  . Colposcopy     Family History  Problem Relation Age of Onset  . Diabetes Mother    History  Substance Use Topics  . Smoking status: Never Smoker   . Smokeless tobacco: Not on file  . Alcohol Use: No   OB History   Grav Para Term Preterm Abortions TAB SAB Ect Mult Living   6 4 4  0 1  1   4      Review of Systems  Constitutional: Negative for fever and chills.  HENT: Positive for rhinorrhea and sinus pressure. Negative for ear pain.   Eyes: Positive for photophobia.  Respiratory: Negative for cough and shortness of breath.   Cardiovascular: Negative for chest pain, palpitations and leg swelling.  Gastrointestinal: Negative for nausea, vomiting and abdominal pain.  Genitourinary: Negative for dysuria, frequency, hematuria, flank pain, vaginal bleeding and vaginal discharge.  Musculoskeletal: Negative for back pain, myalgias, neck pain and neck stiffness.   Skin: Negative for rash and wound.  Neurological: Positive for light-headedness and headaches. Negative for dizziness, syncope, weakness and numbness.  Psychiatric/Behavioral: The patient is nervous/anxious.   All other systems reviewed and are negative.    Allergies  Penicillins; Latex; and Percocet  Home Medications  No current outpatient prescriptions on file. BP 96/71  Pulse 88  Temp(Src) 98.7 F (37.1 C) (Oral)  Resp 20  SpO2 99%  LMP 12/19/2012 Physical Exam  Nursing note and vitals reviewed. Constitutional: She is oriented to person, place, and time. She appears well-developed and well-nourished. No distress.  HENT:  Head: Normocephalic and atraumatic.  Mouth/Throat: Oropharynx is clear and moist. No oropharyngeal exudate.  Bilateral nasal mucosa edema. Tenderness to palpation of her bilateral frontal sinuses.  Eyes: EOM are normal. Pupils are equal, round, and reactive to light.  Neck: Normal range of motion. Neck supple.  No meningismus  Cardiovascular: Normal rate and regular rhythm.   Pulmonary/Chest: Effort normal and breath sounds normal. No respiratory distress. She has no wheezes. She has no rales.  Abdominal: Soft. Bowel sounds are normal. She exhibits no distension and no mass. There is no tenderness. There is no rebound and no guarding.  Musculoskeletal: Normal range of motion. She exhibits no edema and no tenderness.  Neurological: She is alert and oriented to person, place, and time.  Patient is alert and oriented x3 with clear, goal oriented speech. Patient has 5/5 motor in all extremities. Sensation is intact to light touch. Bilateral finger-to-nose is normal with no  signs of dysmetria. Patient has a normal gait and walks without assistance.   Skin: Skin is warm and dry. No rash noted. No erythema.  Psychiatric: She has a normal mood and affect. Her behavior is normal.    ED Course  Procedures (including critical care time) Labs Review Labs Reviewed   CBC WITH DIFFERENTIAL - Abnormal; Notable for the following:    Hemoglobin 10.8 (*)    HCT 33.7 (*)    MCV 76.1 (*)    MCH 24.4 (*)    RDW 16.2 (*)    All other components within normal limits  BASIC METABOLIC PANEL - Abnormal; Notable for the following:    Glucose, Bld 129 (*)    All other components within normal limits  POCT PREGNANCY, URINE - Abnormal; Notable for the following:    Preg Test, Ur POSITIVE (*)    All other components within normal limits  URINALYSIS, ROUTINE W REFLEX MICROSCOPIC   Imaging Review No results found.  EKG Interpretation   None       MDM  Clinically the patient has a sinusitis. The patient is worried because she had a distant relative with a brain cancer and is requesting a CT or MRI of her brain. I have explained at length why these tests are unnecessary at this time. Patient has a normal neurologic exam. She has no red flag signs. We'll treat symptomatically in the emergency department and have her followup with neurology for persistent symptoms. Patient says she is feeling much better. She continues to have a normal neurologic exam. We'll treat for sinus headache. Return precautions have been given.  Loren Racer, MD 02/10/13 [phone]

## 2013-02-09 NOTE — ED Notes (Signed)
CBG= 121

## 2013-02-10 LAB — URINALYSIS, ROUTINE W REFLEX MICROSCOPIC
Hgb urine dipstick: NEGATIVE
Protein, ur: NEGATIVE mg/dL
Urobilinogen, UA: 0.2 mg/dL (ref 0.0–1.0)

## 2013-02-10 LAB — URINE MICROSCOPIC-ADD ON

## 2013-02-10 MED ORDER — MOMETASONE FUROATE 50 MCG/ACT NA SUSP: 2.0000 | Freq: Every day | NASAL | Status: DC

## 2013-02-10 MED ORDER — METOCLOPRAMIDE HCL 10 MG PO TABS: 10.0000 mg | ORAL_TABLET | Freq: Four times a day (QID) | ORAL | Status: DC | PRN

## 2013-02-10 MED ORDER — OXYMETAZOLINE HCL 0.05 % NA SOLN: 1.0000 | Freq: Two times a day (BID) | NASAL | Status: DC

## 2013-03-08 ENCOUNTER — Encounter (HOSPITAL_COMMUNITY): Payer: Self-pay

## 2013-03-08 ENCOUNTER — Inpatient Hospital Stay (HOSPITAL_COMMUNITY)
Admission: AD | Admit: 2013-03-08 | Discharge: 2013-03-08 | Disposition: A | Discharge status: Home / Self Care | Payer: Medicaid Other | Source: Ambulatory Visit | Attending: Obstetrics and Gynecology | Admitting: Obstetrics and Gynecology

## 2013-03-08 DIAGNOSIS — R102 Pelvic and perineal pain: Secondary | ICD-10-CM

## 2013-03-08 DIAGNOSIS — R1032 Left lower quadrant pain: Secondary | ICD-10-CM | POA: Insufficient documentation

## 2013-03-08 DIAGNOSIS — N949 Unspecified condition associated with female genital organs and menstrual cycle: Secondary | ICD-10-CM

## 2013-03-08 LAB — URINALYSIS, ROUTINE W REFLEX MICROSCOPIC
BILIRUBIN URINE: NEGATIVE
Glucose, UA: NEGATIVE mg/dL
Hgb urine dipstick: NEGATIVE
Ketones, ur: NEGATIVE mg/dL
Leukocytes, UA: NEGATIVE
NITRITE: NEGATIVE
PH: 6.5 (ref 5.0–8.0)
Protein, ur: NEGATIVE mg/dL
SPECIFIC GRAVITY, URINE: 1.02 (ref 1.005–1.030)
Urobilinogen, UA: 0.2 mg/dL (ref 0.0–1.0)

## 2013-03-08 LAB — WET PREP, GENITAL
CLUE CELLS WET PREP: NONE SEEN
Trich, Wet Prep: NONE SEEN
Yeast Wet Prep HPF POC: NONE SEEN

## 2013-03-08 LAB — POCT PREGNANCY, URINE
PREG TEST UR: POSITIVE — AB
Preg Test, Ur: NEGATIVE

## 2013-03-08 LAB — HCG, QUANTITATIVE, PREGNANCY: HCG, BETA CHAIN, QUANT, S: 2 m[IU]/mL (ref <5)

## 2013-03-08 MED ORDER — ACETAMINOPHEN-CODEINE #3 300-30 MG PO TABS: 1.0000 | ORAL_TABLET | Freq: Once | ORAL | Status: DC

## 2013-03-08 MED ORDER — KETOROLAC TROMETHAMINE 60 MG/2ML IM SOLN
60.0000 mg | Freq: Once | INTRAMUSCULAR | Status: DC
  Filled 2013-03-08: qty 2

## 2013-03-08 MED ORDER — ACETAMINOPHEN-CODEINE #3 300-30 MG PO TABS: 2.0000 | ORAL_TABLET | Freq: Once | ORAL | Status: DC

## 2013-03-08 NOTE — MAU Note (Signed)
Patient states she terminated a pregnancy in December . Has been having abdominal pain for about one week. Denies bleeding or discharge. Has not had a period since the procedure.

## 2013-03-08 NOTE — Discharge Instructions (Signed)
Mittelschmerz pain usually lasts a few minutes to a few hours, but it may continue for as long as a day or two. Pain from mittelschmerz may be:  On one side of your lower abdomen Dull and cramp-like Sharp and sudden Accompanied by mild vaginal bleeding or discharge Rarely, severe Mittelschmerz pain occurs on the side of the ovary that's releasing an egg (ovulating). The pain may switch sides every other month, or you may feel pain on the same side for several months.  Keep track of your menstrual cycle for several months and note when you feel lower abdominal pain. If it occurs midcycle and goes away without treatment, it's most likely mittelschmerz.

## 2013-03-08 NOTE — Progress Notes (Signed)
There is a + UPT documented in chart that was entered in error. Renne CriglerV. Miller, NT [facility] + value [facility]. UPT and BHCG are negative today.

## 2013-03-08 NOTE — Progress Notes (Signed)
Pt refused pain medication

## 2013-03-08 NOTE — MAU Provider Note (Signed)
History     CSN: [id number]  Arrival date and time: 03/08/13 1221   First Provider Initiated Contact with Patient 03/08/13 1507      Chief Complaint  Patient presents with  . Abdominal Pain   HPI  Pt is a 28 yo [id number] here with report of terminating a pregnancy in December . Reports LLQ pain that started last night. Pain is described as crampy with increased lower pelvic pressure.  Denies bleeding or discharge. No menstrual period since procedure.    Past Medical History  Diagnosis Date  . Anemia   . Abnormal Pap smear of cervix   . Blood transfusion 2005  . Postpartum hemorrhage 09/2003    Past Surgical History  Procedure Laterality Date  . Colposcopy      Family History  Problem Relation Age of Onset  . Diabetes Mother     History  Substance Use Topics  . Smoking status: Never Smoker   . Smokeless tobacco: Not on file  . Alcohol Use: No    Allergies:  Allergies  Allergen Reactions  . Penicillins Anaphylaxis  . Latex Hives and Swelling  . Percocet [Oxycodone-Acetaminophen] Nausea Only    Prescriptions prior to admission  Medication Sig Dispense Refill  . metoCLOPramide (REGLAN) 10 MG tablet Take 1 tablet (10 mg total) by mouth every 6 (six) hours as needed for nausea (nausea/headache).  6 tablet  0  . mometasone (NASONEX) 50 MCG/ACT nasal spray Place 2 sprays into the nose daily.  17 g  12  . oxymetazoline (AFRIN NASAL SPRAY) 0.05 % nasal spray Place 1 spray into both nostrils 2 (two) times daily.  30 mL  0    Review of Systems  Constitutional: Negative for fever.  Gastrointestinal: Positive for abdominal pain (cramping). Negative for nausea and vomiting.  Genitourinary: Negative for dysuria, urgency, frequency and hematuria.  All other systems reviewed and are negative.   Physical Exam   Blood pressure 117/83, pulse 77, temperature 98.9 F (37.2 C), temperature source Oral, resp. rate 16, height 5' 6.5" (1.689 m), weight 86.728 kg (191 lb 3.2 oz),  last menstrual period 12/19/2012, SpO2 100.00%, unknown if currently breastfeeding.  Physical Exam  Constitutional: She is oriented to person, place, and time. She appears well-developed and well-nourished. No distress.  Appears uncomfortable  HENT:  Head: Normocephalic.  Eyes: Pupils are equal, round, and reactive to light.  Neck: Normal range of motion. Neck supple.  Cardiovascular: Normal rate, regular rhythm and normal heart sounds.   Respiratory: Effort normal and breath sounds normal.  GI: Soft. She exhibits no mass. There is tenderness. There is no guarding.  Genitourinary: Left adnexum displays tenderness. Vaginal discharge (white, clear egg-white consistency discharge) found.  Negative cervical motion tenderness  Musculoskeletal: Normal range of motion. She exhibits no edema.  Neurological: She is alert and oriented to person, place, and time. She has normal reflexes.  Skin: Skin is warm and dry.    MAU Course  Procedures Results for orders placed during the hospital encounter of 03/08/13 (from the past 24 hour(s))  POCT PREGNANCY, URINE     Status: None   Collection Time    03/08/13  1:19 PM      Result Value Range   Preg Test, Ur NEGATIVE  NEGATIVE  URINALYSIS, ROUTINE W REFLEX MICROSCOPIC     Status: None   Collection Time    03/08/13  1:34 PM      Result Value Range   Color, Urine YELLOW  YELLOW  APPearance CLEAR  CLEAR   Specific Gravity, Urine 1.020  1.005 - 1.030   pH 6.5  5.0 - 8.0   Glucose, UA NEGATIVE  NEGATIVE mg/dL   Hgb urine dipstick NEGATIVE  NEGATIVE   Bilirubin Urine NEGATIVE  NEGATIVE   Ketones, ur NEGATIVE  NEGATIVE mg/dL   Protein, ur NEGATIVE  NEGATIVE mg/dL   Urobilinogen, UA 0.2  0.0 - 1.0 mg/dL   Nitrite NEGATIVE  NEGATIVE   Leukocytes, UA NEGATIVE  NEGATIVE  POCT PREGNANCY, URINE     Status: Abnormal   Collection Time    03/08/13  2:33 PM      Result Value Range   Preg Test, Ur POSITIVE (*) NEGATIVE  HCG, QUANTITATIVE, PREGNANCY      Status: None   Collection Time    03/08/13  3:14 PM      Result Value Range   hCG, Beta Chain, Quant, S 2  <5 mIU/mL  WET PREP, GENITAL     Status: Abnormal   Collection Time    03/08/13  4:10 PM      Result Value Range   Yeast Wet Prep HPF POC NONE SEEN  NONE SEEN   Trich, Wet Prep NONE SEEN  NONE SEEN   Clue Cells Wet Prep HPF POC NONE SEEN  NONE SEEN   WBC, Wet Prep HPF POC FEW (*) NONE SEEN     Assessment and Plan  Pelvic Pain - ? Ovulation  Plan: Discharge to home Ibuprofen for pain Follow-up if pain not resolved in 24 hours GC/CT pending  [facility],Jahmeer Porche 03/08/2013, 3:08 PM

## 2013-03-08 NOTE — Progress Notes (Signed)
In an attempt to clarify patient information, patient states she did not have an elective abortion. She was given medication to terminate due to failed pregnancy or ? ectopic in [place]. Patient states she was given an injection (?MTX). Was supposed to F/U with MD here in [place], but she never did.

## 2013-03-09 LAB — GC/CHLAMYDIA PROBE AMP
CT Probe RNA: NEGATIVE
GC PROBE AMP APTIMA: NEGATIVE

## 2013-03-10 ENCOUNTER — Other Ambulatory Visit: Payer: Self-pay | Admitting: Family

## 2013-03-10 MED ORDER — MEDROXYPROGESTERONE ACETATE 10 MG PO TABS: 10.0000 mg | ORAL_TABLET | Freq: Every day | ORAL | Status: DC

## 2013-03-10 NOTE — Progress Notes (Signed)
Pt plans to have IUD placed and desires provera to begin bleed for insertion.  RX Provera 10 mg x 5 days sent to pharmacy.

## 2013-07-05 ENCOUNTER — Encounter (HOSPITAL_BASED_OUTPATIENT_CLINIC_OR_DEPARTMENT_OTHER): Payer: Self-pay | Admitting: Emergency Medicine

## 2013-07-05 ENCOUNTER — Emergency Department (HOSPITAL_BASED_OUTPATIENT_CLINIC_OR_DEPARTMENT_OTHER)
Admission: EM | Admit: 2013-07-05 | Discharge: 2013-07-05 | Disposition: A | Discharge status: Home / Self Care | Payer: Medicaid Other | Attending: Emergency Medicine | Admitting: Emergency Medicine

## 2013-07-05 DIAGNOSIS — M7989 Other specified soft tissue disorders: Secondary | ICD-10-CM | POA: Insufficient documentation

## 2013-07-05 DIAGNOSIS — Z8739 Personal history of other diseases of the musculoskeletal system and connective tissue: Secondary | ICD-10-CM

## 2013-07-05 DIAGNOSIS — IMO0002 Reserved for concepts with insufficient information to code with codable children: Secondary | ICD-10-CM | POA: Insufficient documentation

## 2013-07-05 DIAGNOSIS — Z880 Allergy status to penicillin: Secondary | ICD-10-CM | POA: Insufficient documentation

## 2013-07-05 DIAGNOSIS — Z91040 Latex allergy status: Secondary | ICD-10-CM | POA: Insufficient documentation

## 2013-07-05 DIAGNOSIS — E119 Type 2 diabetes mellitus without complications: Secondary | ICD-10-CM | POA: Insufficient documentation

## 2013-07-05 DIAGNOSIS — Z3202 Encounter for pregnancy test, result negative: Secondary | ICD-10-CM | POA: Insufficient documentation

## 2013-07-05 DIAGNOSIS — Z862 Personal history of diseases of the blood and blood-forming organs and certain disorders involving the immune mechanism: Secondary | ICD-10-CM | POA: Insufficient documentation

## 2013-07-05 DIAGNOSIS — Z79899 Other long term (current) drug therapy: Secondary | ICD-10-CM | POA: Insufficient documentation

## 2013-07-05 LAB — COMPREHENSIVE METABOLIC PANEL
ALBUMIN: 4.2 g/dL (ref 3.5–5.2)
ALK PHOS: 90 U/L (ref 39–117)
ALT: 18 U/L (ref 0–35)
AST: 23 U/L (ref 0–37)
BUN: 14 mg/dL (ref 6–23)
CO2: 25 mEq/L (ref 19–32)
Calcium: 9.7 mg/dL (ref 8.4–10.5)
Chloride: 102 mEq/L (ref 96–112)
Creatinine, Ser: 0.8 mg/dL (ref 0.50–1.10)
GFR calc Af Amer: >90 mL/min (ref >90)
GFR calc non Af Amer: >90 mL/min (ref >90)
Glucose, Bld: 95 mg/dL (ref 70–99)
Potassium: 3.9 mEq/L (ref 3.7–5.3)
SODIUM: 140 meq/L (ref 137–147)
TOTAL PROTEIN: 8.2 g/dL (ref 6.0–8.3)
Total Bilirubin: 0.2 mg/dL — ABNORMAL LOW (ref 0.3–1.2)

## 2013-07-05 LAB — CBC WITH DIFFERENTIAL/PLATELET
BASOS ABS: 0.1 10*3/uL (ref 0.0–0.1)
Basophils Relative: 1 % (ref 0–1)
Eosinophils Absolute: 0.2 10*3/uL (ref 0.0–0.7)
Eosinophils Relative: 3 % (ref 0–5)
HCT: 33.9 % — ABNORMAL LOW (ref 36.0–46.0)
Hemoglobin: 10.4 g/dL — ABNORMAL LOW (ref 12.0–15.0)
LYMPHS PCT: 31 % (ref 12–46)
Lymphs Abs: 2 10*3/uL (ref 0.7–4.0)
MCH: 22.1 pg — AB (ref 26.0–34.0)
MCHC: 30.7 g/dL (ref 30.0–36.0)
MCV: 72 fL — ABNORMAL LOW (ref 78.0–100.0)
MONO ABS: 0.6 10*3/uL (ref 0.1–1.0)
Monocytes Relative: 9 % (ref 3–12)
NEUTROS PCT: 56 % (ref 43–77)
Neutro Abs: 3.6 10*3/uL (ref 1.7–7.7)
PLATELETS: 316 10*3/uL (ref 150–400)
RBC: 4.71 MIL/uL (ref 3.87–5.11)
RDW: 19 % — ABNORMAL HIGH (ref 11.5–15.5)
WBC: 6.5 10*3/uL (ref 4.0–10.5)

## 2013-07-05 LAB — URINALYSIS, ROUTINE W REFLEX MICROSCOPIC
Bilirubin Urine: NEGATIVE
Glucose, UA: NEGATIVE mg/dL
Hgb urine dipstick: NEGATIVE
KETONES UR: 15 mg/dL — AB
Leukocytes, UA: NEGATIVE
NITRITE: NEGATIVE
PH: 7.5 (ref 5.0–8.0)
PROTEIN: NEGATIVE mg/dL
Specific Gravity, Urine: 1.033 — ABNORMAL HIGH (ref 1.005–1.030)
Urobilinogen, UA: 1 mg/dL (ref 0.0–1.0)

## 2013-07-05 LAB — PREGNANCY, URINE: PREG TEST UR: NEGATIVE

## 2013-07-05 NOTE — ED Provider Notes (Signed)
Medical screening examination/treatment/procedure(s) were performed by non-physician practitioner and as supervising physician I was immediately available for consultation/collaboration.  Calem Cocozza, MD 07/05/13 2322 

## 2013-07-05 NOTE — ED Provider Notes (Signed)
CSN: [id number]     Arrival date & time 07/05/13  1755 History   First MD Initiated Contact with Patient 07/05/13 1834     Chief Complaint  Patient presents with  . Leg Swelling     (Consider location/radiation/quality/duration/timing/severity/associated sxs/prior Treatment) The history is provided by the patient.   Patricia Macdonald is a 28 y.o. female who presents to the ED with swelling of her hands and feet that has been going on for a few days. She has no swelling at this time. She states that she feels tired all the time. She denies pain. She states that her finger nails appear yellow to her. She had a SAB a couple months ago and has not been for follow up. She is wondering if everything is ok. She denies vaginal bleeding or discharge and has not had sex sine the SAB. [facility]  Past Medical History  Diagnosis Date  . Anemia   . Abnormal Pap smear of cervix   . Blood transfusion 2005  . Postpartum hemorrhage 09/2003   Past Surgical History  Procedure Laterality Date  . Colposcopy     Family History  Problem Relation Age of Onset  . Diabetes Mother    History  Substance Use Topics  . Smoking status: Never Smoker   . Smokeless tobacco: Not on file  . Alcohol Use: No   OB History   Grav Para Term Preterm Abortions TAB SAB Ect Mult Living   6 4 4  0 2  2   4      Review of Systems  Constitutional: Negative for fever and chills.  HENT: Negative.   Eyes: Negative for visual disturbance.  Respiratory: Negative for cough and shortness of breath.   Cardiovascular: Negative for chest pain.  Gastrointestinal: Negative for nausea, vomiting and abdominal pain.  Genitourinary: Negative for dysuria, urgency, frequency, vaginal bleeding and vaginal discharge.  Musculoskeletal: Negative for back pain and neck pain.       Swelling of hands and feet  Skin: Negative for rash.  Neurological: Negative for light-headedness and headaches.   Psychiatric/Behavioral: Negative for confusion. The patient is not nervous/anxious.       Allergies  Penicillins; Latex; and Percocet  Home Medications   Prior to Admission medications   Medication Sig Start Date End Date Taking? Authorizing Provider  medroxyPROGESTERone (PROVERA) 10 MG tablet Take 1 tablet (10 mg total) by mouth daily. 03/10/13   Melissa NoonWalidah N Muhammad, CNM  metoCLOPramide (REGLAN) 10 MG tablet Take 1 tablet (10 mg total) by mouth every 6 (six) hours as needed for nausea (nausea/headache). 02/10/13   Loren Raceravid Yelverton, MD  mometasone (NASONEX) 50 MCG/ACT nasal spray Place 2 sprays into the nose daily. 02/10/13   Loren Raceravid Yelverton, MD  oxymetazoline (AFRIN NASAL SPRAY) 0.05 % nasal spray Place 1 spray into both nostrils 2 (two) times daily. 02/10/13   Loren Raceravid Yelverton, MD   BP 131/83  Pulse 90  Temp(Src) 99.1 F (37.3 C) (Oral)  Resp 16  Ht 5\' 6"  (1.676 m)  Wt 189 lb (85.73 kg)  BMI 30.52 kg/m2  SpO2 99%  LMP 06/21/2013 Physical Exam  Nursing note and vitals reviewed. Constitutional: She is oriented to person, place, and time. She appears well-developed and well-nourished.  HENT:  Head: Normocephalic and atraumatic.  Eyes: Conjunctivae and EOM are normal. Pupils are equal, round, and reactive to light.  Neck: Neck supple.  Cardiovascular: Normal rate, regular rhythm and normal heart sounds.  Pulmonary/Chest: Effort normal. She has no wheezes. She has no rales.  Abdominal: Soft. Bowel sounds are normal. There is no tenderness.  Musculoskeletal: Normal range of motion. She exhibits no edema and no tenderness.  Nail beds pink with good capillary refill. No jaundice noted. Now edema of hands or feet noted. Radial and pedal pulses strong.   Neurological: She is alert and oriented to person, place, and time. No cranial nerve deficit.  Skin: Skin is warm and dry.  Psychiatric: She has a normal mood and affect. Her behavior is normal.   Results for orders placed during the  hospital encounter of 07/05/13 (from the past 24 hour(s))  URINALYSIS, ROUTINE W REFLEX MICROSCOPIC     Status: Abnormal   Collection Time    07/05/13  5:59 PM      Result Value Ref Range   Color, Urine YELLOW  YELLOW   APPearance CLEAR  CLEAR   Specific Gravity, Urine 1.033 (*) 1.005 - 1.030   pH 7.5  5.0 - 8.0   Glucose, UA NEGATIVE  NEGATIVE mg/dL   Hgb urine dipstick NEGATIVE  NEGATIVE   Bilirubin Urine NEGATIVE  NEGATIVE   Ketones, ur 15 (*) NEGATIVE mg/dL   Protein, ur NEGATIVE  NEGATIVE mg/dL   Urobilinogen, UA 1.0  0.0 - 1.0 mg/dL   Nitrite NEGATIVE  NEGATIVE   Leukocytes, UA NEGATIVE  NEGATIVE  PREGNANCY, URINE     Status: None   Collection Time    07/05/13  5:59 PM      Result Value Ref Range   Preg Test, Ur NEGATIVE  NEGATIVE  CBC WITH DIFFERENTIAL     Status: Abnormal   Collection Time    07/05/13  7:20 PM      Result Value Ref Range   WBC 6.5  4.0 - 10.5 K/uL   RBC 4.71  3.87 - 5.11 MIL/uL   Hemoglobin 10.4 (*) 12.0 - 15.0 g/dL   HCT 16.133.9 (*) 09.636.0 - 04.546.0 %   MCV 72.0 (*) 78.0 - 100.0 fL   MCH 22.1 (*) 26.0 - 34.0 pg   MCHC 30.7  30.0 - 36.0 g/dL   RDW 40.919.0 (*) 81.111.5 - 91.415.5 %   Platelets 316  150 - 400 K/uL   Neutrophils Relative % 56  43 - 77 %   Lymphocytes Relative 31  12 - 46 %   Monocytes Relative 9  3 - 12 %   Eosinophils Relative 3  0 - 5 %   Basophils Relative 1  0 - 1 %   Neutro Abs 3.6  1.7 - 7.7 K/uL   Lymphs Abs 2.0  0.7 - 4.0 K/uL   Monocytes Absolute 0.6  0.1 - 1.0 K/uL   Eosinophils Absolute 0.2  0.0 - 0.7 K/uL   Basophils Absolute 0.1  0.0 - 0.1 K/uL  COMPREHENSIVE METABOLIC PANEL     Status: Abnormal   Collection Time    07/05/13  7:20 PM      Result Value Ref Range   Sodium 140  137 - 147 mEq/L   Potassium 3.9  3.7 - 5.3 mEq/L   Chloride 102  96 - 112 mEq/L   CO2 25  19 - 32 mEq/L   Glucose, Bld 95  70 - 99 mg/dL   BUN 14  6 - 23 mg/dL   Creatinine, Ser 7.820.80  0.50 - 1.10 mg/dL   Calcium 9.7  8.4 - 95.610.5 mg/dL   Total Protein 8.2   6.0 -  8.3 g/dL   Albumin 4.2  3.5 - 5.2 g/dL   AST 23  0 - 37 U/L   ALT 18  0 - 35 U/L   Alkaline Phosphatase 90  39 - 117 U/L   Total Bilirubin 0.2 (*) 0.3 - 1.2 mg/dL   GFR calc non Af Amer >90  >90 mL/min   GFR calc Af Amer >90  >90 mL/min    ED Course  Procedures  MDM   28 y.o. female with history of swelling of her hands and feet for the past few days. She is also concerned because she did not go for her follow up after her SAB 2 months ago. Patient stable for discharge without edema of hands or feet noted. Normal labs. Discussed with the patient clinical and lab findings and plan of care and she voices understanding. She will cut back on salt and increase her water intake. She will schedule an appointment for follow up with her GYN and her PCP.  She will return for problems.  Janne Napoleon, [place] 07/05/13 [phone]

## 2013-07-05 NOTE — ED Notes (Addendum)
Pt c/o lower leg swelling x 2 days also c/o nail beds yellow " today" " also wants" f/u check from miscarriage x 2 months ago "

## 2013-07-05 NOTE — Discharge Instructions (Signed)
Your exam tonight does not show any swelling. All your blood work is normal. Your should make an appointment with the GYN to follow up on having your IUD placed. You should make an appointment with your PCP for a complete physical exam

## 2013-10-20 ENCOUNTER — Encounter (HOSPITAL_COMMUNITY): Payer: Self-pay | Admitting: *Deleted

## 2013-10-20 ENCOUNTER — Inpatient Hospital Stay (HOSPITAL_COMMUNITY)
Admission: AD | Admit: 2013-10-20 | Discharge: 2013-10-20 | Disposition: A | Discharge status: Home Health | Payer: BC Managed Care – HMO | Source: Ambulatory Visit | Attending: Obstetrics | Admitting: Obstetrics

## 2013-10-20 DIAGNOSIS — Z30432 Encounter for removal of intrauterine contraceptive device: Secondary | ICD-10-CM | POA: Insufficient documentation

## 2013-10-20 DIAGNOSIS — R109 Unspecified abdominal pain: Secondary | ICD-10-CM | POA: Insufficient documentation

## 2013-10-20 LAB — POCT PREGNANCY, URINE: PREG TEST UR: NEGATIVE

## 2013-10-20 NOTE — Discharge Instructions (Signed)
Contraception Choices Contraception (birth control) is the use of any methods or devices to prevent pregnancy. Below are some methods to help avoid pregnancy. HORMONAL METHODS   Contraceptive implant. This is a thin, plastic tube containing progesterone hormone. It does not contain estrogen hormone. Your health care provider inserts the tube in the inner part of the upper arm. The tube can remain in place for up to 3 years. After 3 years, the implant must be removed. The implant prevents the ovaries from releasing an egg (ovulation), thickens the cervical mucus to prevent sperm from entering the uterus, and thins the lining of the inside of the uterus.  Progesterone-only injections. These injections are given every 3 months by your health care provider to prevent pregnancy. This synthetic progesterone hormone stops the ovaries from releasing eggs. It also thickens cervical mucus and changes the uterine lining. This makes it harder for sperm to survive in the uterus.  Birth control pills. These pills contain estrogen and progesterone hormone. They work by preventing the ovaries from releasing eggs (ovulation). They also cause the cervical mucus to thicken, preventing the sperm from entering the uterus. Birth control pills are prescribed by a health care provider.Birth control pills can also be used to treat heavy periods.  Minipill. This type of birth control pill contains only the progesterone hormone. They are taken every day of each month and must be prescribed by your health care provider.  Birth control patch. The patch contains hormones similar to those in birth control pills. It must be changed once a week and is prescribed by a health care provider.  Vaginal ring. The ring contains hormones similar to those in birth control pills. It is left in the vagina for 3 weeks, removed for 1 week, and then a new one is put back in place. The patient must be comfortable inserting and removing the ring  from the vagina.A health care provider's prescription is necessary.  Emergency contraception. Emergency contraceptives prevent pregnancy after unprotected sexual intercourse. This pill can be taken right after sex or up to 5 days after unprotected sex. It is most effective the sooner you take the pills after having sexual intercourse. Most emergency contraceptive pills are available without a prescription. Check with your pharmacist. Do not use emergency contraception as your only form of birth control. BARRIER METHODS   Female condom. This is a thin sheath (latex or rubber) that is worn over the penis during sexual intercourse. It can be used with spermicide to increase effectiveness.  Female condom. This is a soft, loose-fitting sheath that is put into the vagina before sexual intercourse.  Diaphragm. This is a soft, latex, dome-shaped barrier that must be fitted by a health care provider. It is inserted into the vagina, along with a spermicidal jelly. It is inserted before intercourse. The diaphragm should be left in the vagina for 6 to 8 hours after intercourse.  Cervical cap. This is a round, soft, latex or plastic cup that fits over the cervix and must be fitted by a health care provider. The cap can be left in place for up to 48 hours after intercourse.  Sponge. This is a soft, circular piece of polyurethane foam. The sponge has spermicide in it. It is inserted into the vagina after wetting it and before sexual intercourse.  Spermicides. These are chemicals that kill or block sperm from entering the cervix and uterus. They come in the form of creams, jellies, suppositories, foam, or tablets. They do not require a   prescription. They are inserted into the vagina with an applicator before having sexual intercourse. The process must be repeated every time you have sexual intercourse. INTRAUTERINE CONTRACEPTION  Intrauterine device (IUD). This is a T-shaped device that is put in a woman's uterus  during a menstrual period to prevent pregnancy. There are 2 types:  Copper IUD. This type of IUD is wrapped in copper wire and is placed inside the uterus. Copper makes the uterus and fallopian tubes produce a fluid that kills sperm. It can stay in place for 10 years.  Hormone IUD. This type of IUD contains the hormone progestin (synthetic progesterone). The hormone thickens the cervical mucus and prevents sperm from entering the uterus, and it also thins the uterine lining to prevent implantation of a fertilized egg. The hormone can weaken or kill the sperm that get into the uterus. It can stay in place for 3-5 years, depending on which type of IUD is used. PERMANENT METHODS OF CONTRACEPTION  Female tubal ligation. This is when the woman's fallopian tubes are surgically sealed, tied, or blocked to prevent the egg from traveling to the uterus.  Hysteroscopic sterilization. This involves placing a small coil or insert into each fallopian tube. Your doctor uses a technique called hysteroscopy to do the procedure. The device causes scar tissue to form. This results in permanent blockage of the fallopian tubes, so the sperm cannot fertilize the egg. It takes about 3 months after the procedure for the tubes to become blocked. You must use another form of birth control for these 3 months.  Female sterilization. This is when the female has the tubes that carry sperm tied off (vasectomy).This blocks sperm from entering the vagina during sexual intercourse. After the procedure, the man can still ejaculate fluid (semen). NATURAL PLANNING METHODS  Natural family planning. This is not having sexual intercourse or using a barrier method (condom, diaphragm, cervical cap) on days the woman could become pregnant.  Calendar method. This is keeping track of the length of each menstrual cycle and identifying when you are fertile.  Ovulation method. This is avoiding sexual intercourse during ovulation.  Symptothermal  method. This is avoiding sexual intercourse during ovulation, using a thermometer and ovulation symptoms.  Post-ovulation method. This is timing sexual intercourse after you have ovulated. Regardless of which type or method of contraception you choose, it is important that you use condoms to protect against the transmission of sexually transmitted infections (STIs). Talk with your health care provider about which form of contraception is most appropriate for you. Document Released: 02/17/2005 Document Revised: 02/22/2013 Document Reviewed: 08/12/2012 [employer] Patient Information 2015 [employer]. This information is not intended to replace advice given to you by your health care provider. Make sure you discuss any questions you have with your health care provider.  

## 2013-10-20 NOTE — MAU Provider Note (Signed)
  History     CSN: [id number]  Arrival date and time: 10/20/13 1013   First Provider Initiated Contact with Patient 10/20/13 1129      Chief Complaint  Patient presents with  . Abdominal Pain   HPI Comments: Patricia Macdonald 28 y.o. [id number] [facility] to have her IUD removed. She had it placed 3 weeks ago and since then has felt like she is ' in a fog' and wants it out. She went to the office today and was told to come to the MAU for removal due to insurance issues.   Abdominal Pain      Past Medical History  Diagnosis Date  . Anemia   . Abnormal Pap smear of cervix   . Blood transfusion 2005  . Postpartum hemorrhage 09/2003    Past Surgical History  Procedure Laterality Date  . Colposcopy      Family History  Problem Relation Age of Onset  . Diabetes Mother     History  Substance Use Topics  . Smoking status: Never Smoker   . Smokeless tobacco: Not on file  . Alcohol Use: No    Allergies:  Allergies  Allergen Reactions  . Penicillins Anaphylaxis  . Latex Hives and Swelling  . Percocet [Oxycodone-Acetaminophen] Nausea Only    No prescriptions prior to admission    Review of Systems  Constitutional: Negative.   HENT: Negative.   Eyes: Negative.   Respiratory: Negative.   Cardiovascular: Negative.   Gastrointestinal: Positive for abdominal pain.  Genitourinary: Negative.   Musculoskeletal: Negative.   Skin: Negative.   Neurological: Negative.        Fog  Psychiatric/Behavioral: Negative.    Physical Exam   Blood pressure 119/79, pulse 74, temperature 99.8 F (37.7 C), temperature source Oral, resp. rate 18, unknown if currently breastfeeding.  Physical Exam  Constitutional: She is oriented to person, place, and time. She appears well-developed and well-nourished. No distress.  HENT:  Head: Normocephalic and atraumatic.  Eyes: Pupils are equal, round, and reactive to light.  GI: Soft. Bowel sounds are  normal. She exhibits no distension. There is no tenderness. There is no rebound and no guarding.  Genitourinary:  Genital:external negative Vaginal:small amount blood Cervix:strings present/ iud removed without issue Bimanual:nontender/ no mass   Musculoskeletal: Normal range of motion.  Neurological: She is alert and oriented to person, place, and time.  Skin: Skin is warm and dry.  Psychiatric: She has a normal mood and affect. Her behavior is normal. Judgment and thought content normal.    MAU Course  Procedures  MDM   Assessment and Plan   A: Contraception  Management  P: Removed IUD Pt declines any other forms of birth control Follow up with OBGYN  Patricia Macdonald, Patricia Macdonald 10/20/2013, 11:43 AM

## 2013-10-20 NOTE — MAU Note (Signed)
Urine in lab 

## 2013-10-20 NOTE — MAU Provider Note (Signed)
Attestation of Attending Supervision of Advanced Practitioner (PA/CNM/NP): Evaluation and management procedures were performed by the Advanced Practitioner under my supervision and collaboration.  I have reviewed the Advanced Practitioner's note and chart, and I agree with the management and plan.  PRATT,TANYA S, MD [facility] Attending 10/20/2013 1:48 PM   

## 2013-10-20 NOTE — MAU Note (Addendum)
Been having cramping in lower abd for a few days.  Unbearable at night. Feels like she is in a fog, can't focus- though she has not taken anything to make this happen. Things started after she got her IUD placed.- went to office to get IUD removed; so they sent her here.   Also having pain with intercourse

## 2013-12-26 ENCOUNTER — Emergency Department (HOSPITAL_COMMUNITY): Payer: BC Managed Care – HMO

## 2013-12-26 ENCOUNTER — Encounter (HOSPITAL_COMMUNITY): Payer: Self-pay | Admitting: Emergency Medicine

## 2013-12-26 ENCOUNTER — Emergency Department (HOSPITAL_COMMUNITY)
Admission: EM | Admit: 2013-12-26 | Discharge: 2013-12-27 | Disposition: A | Discharge status: Home / Self Care | Payer: BC Managed Care – HMO | Attending: Emergency Medicine | Admitting: Emergency Medicine

## 2013-12-26 DIAGNOSIS — R4701 Aphasia: Secondary | ICD-10-CM | POA: Diagnosis not present

## 2013-12-26 DIAGNOSIS — Z880 Allergy status to penicillin: Secondary | ICD-10-CM | POA: Insufficient documentation

## 2013-12-26 DIAGNOSIS — R42 Dizziness and giddiness: Secondary | ICD-10-CM | POA: Insufficient documentation

## 2013-12-26 DIAGNOSIS — Z91040 Latex allergy status: Secondary | ICD-10-CM | POA: Insufficient documentation

## 2013-12-26 DIAGNOSIS — Z862 Personal history of diseases of the blood and blood-forming organs and certain disorders involving the immune mechanism: Secondary | ICD-10-CM | POA: Insufficient documentation

## 2013-12-26 DIAGNOSIS — F439 Reaction to severe stress, unspecified: Secondary | ICD-10-CM | POA: Diagnosis not present

## 2013-12-26 DIAGNOSIS — R4789 Other speech disturbances: Secondary | ICD-10-CM

## 2013-12-26 DIAGNOSIS — R51 Headache: Secondary | ICD-10-CM | POA: Insufficient documentation

## 2013-12-26 DIAGNOSIS — M542 Cervicalgia: Secondary | ICD-10-CM | POA: Insufficient documentation

## 2013-12-26 DIAGNOSIS — R251 Tremor, unspecified: Secondary | ICD-10-CM | POA: Diagnosis not present

## 2013-12-26 DIAGNOSIS — R519 Headache, unspecified: Secondary | ICD-10-CM

## 2013-12-26 MED ORDER — METOCLOPRAMIDE HCL 5 MG/ML IJ SOLN
10.0000 mg | Freq: Once | INTRAMUSCULAR | Status: AC
  Administered 2013-12-26: 10 mg via INTRAMUSCULAR
  Filled 2013-12-26: qty 2

## 2013-12-26 MED ORDER — LORAZEPAM 2 MG/ML IJ SOLN
1.0000 mg | Freq: Once | INTRAMUSCULAR | Status: AC
  Administered 2013-12-26: 1 mg via INTRAMUSCULAR
  Filled 2013-12-26: qty 1

## 2013-12-26 MED ORDER — DIPHENHYDRAMINE HCL 50 MG/ML IJ SOLN
25.0000 mg | Freq: Once | INTRAMUSCULAR | Status: AC
  Administered 2013-12-26: 25 mg via INTRAMUSCULAR
  Filled 2013-12-26: qty 1

## 2013-12-26 MED ORDER — KETOROLAC TROMETHAMINE 60 MG/2ML IM SOLN
60.0000 mg | Freq: Once | INTRAMUSCULAR | Status: AC
  Administered 2013-12-26: 60 mg via INTRAMUSCULAR
  Filled 2013-12-26: qty 2

## 2013-12-26 NOTE — ED Notes (Signed)
Per EMS: Pt from work.  C/o headache and neck pain x 15 mins.  Pt began to twitch all over when EMS attempted to get her BP.  No NVD.  NAD.  A&O x 4.

## 2013-12-26 NOTE — ED Provider Notes (Signed)
CSN: [id number]     Arrival date & time 12/26/13  1851 History   First MD Initiated Contact with Patient 12/26/13 2113     Chief Complaint  Patient presents with  . Neck Pain  . Headache     (Consider location/radiation/quality/duration/timing/severity/associated sxs/prior Treatment) HPI Patricia Macdonald is a 28 y.o. female who presents to emergency department complaining of sudden onset of headache approximately 15 minutes ago. 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 She denies any sudden onset of headache. She does report she felt nauseated. No vomiting. She states pain radiates into the neck. Denies any blurred vision. Denies any injuries. Denies history of the same. States she did have a headache all last week. States it was different, states was dull and similar to prior headaches she had. Patient states she is also having difficulty talking and states her right arm is "jumping."  Past Medical History  Diagnosis Date  . Anemia   . Abnormal Pap smear of cervix   . Blood transfusion 2005  . Postpartum hemorrhage 09/2003   Past Surgical History  Procedure Laterality Date  . Colposcopy     Family History  Problem Relation Age of Onset  . Diabetes Mother    History  Substance Use Topics  . Smoking status: Never Smoker   . Smokeless tobacco: Not on file  . Alcohol Use: No   OB History   Grav Para Term Preterm Abortions TAB SAB Ect Mult Living   6 4 4  0 2  2   4      Review of Systems  Constitutional: Negative for fever and chills.  Respiratory: Negative for cough, chest tightness and shortness of breath.   Cardiovascular: Negative for chest pain, palpitations and leg swelling.  Gastrointestinal: Negative for nausea, vomiting, abdominal pain and diarrhea.  Genitourinary: Negative for flank pain.  Musculoskeletal: Positive for neck pain. Negative for arthralgias,  myalgias and neck stiffness.  Skin: Negative for rash.  Neurological: Positive for dizziness, tremors and headaches. Negative for weakness and numbness.  All other systems reviewed and are negative.     Allergies  Penicillins; Latex; and Percocet  Home Medications   Prior to Admission medications   Not on File   BP 131/79  Pulse 81  Temp(Src) 98.6 F (37 C) (Oral)  Resp 18  SpO2 100% Physical Exam  Nursing note and vitals reviewed. Constitutional: She is oriented to person, place, and time. She appears well-developed and well-nourished.  Patient is tearful  HENT:  Head: Normocephalic.  Eyes: Conjunctivae and EOM are normal. Pupils are equal, round, and reactive to light.  Neck: Normal range of motion. Neck supple.  No meningismus  Cardiovascular: Normal rate, regular rhythm and normal heart sounds.   Pulmonary/Chest: Effort normal and breath sounds normal. No respiratory distress. She has no wheezes. She has no rales.  Abdominal: Soft. Bowel sounds are normal. She exhibits no distension. There is no tenderness. There is no rebound.  Musculoskeletal: She exhibits no edema.  Neurological: She is alert and oriented to person, place, and time.  5 out of 5 and equal grip strength bilaterally. 5 out of 5 and equal strength of biceps, triceps. Patient has weakness with holding her right arm in front of her compared to the left, with some right-sided drift. She is able to do finger to nose however has intentional tremor on the right side when  approaching her nose and my finger. She has involuntary intermittent tremors in the right hand, lasting just a few seconds at a time. 5/5 and equal strength of lower extremities bilaterally. Cranial nerves intact. Visual fields intact.   Skin: Skin is warm and dry.  Psychiatric: She has a normal mood and affect. Her behavior is normal.    ED Course  Procedures (including critical care time) Labs Review Labs Reviewed - No data to  display  Imaging Review Ct Head Wo Contrast  12/26/2013   CLINICAL DATA:  Headache and neck pain.  Initial encounter  EXAM: CT HEAD WITHOUT CONTRAST  TECHNIQUE: Contiguous axial images were obtained from the base of the skull through the vertex without intravenous contrast.  COMPARISON:  None.  FINDINGS: Skull and Sinuses:Negative for fracture or destructive process. The mastoids, middle ears, and imaged paranasal sinuses are clear.  Orbits: No acute abnormality.  Brain: No evidence of acute abnormality, such as acute infarction, hemorrhage, hydrocephalus, or mass lesion/mass effect.  IMPRESSION: Negative head CT.   Electronically Signed   By: Tiburcio PeaJonathan  Watts M.D.   On: 12/26/2013 23:06     EKG Interpretation None      MDM   Final diagnoses:  Headache  Tremor  Slow rate of speech    Pt with sudden onset of headache, dizziness, right arm tremor and drift. [facility] Will [facility]. Symptoms began 15 min prior to coming in. Pt appears very anxious, will try ativan.   11:45 PM  No improvement with Ativan. CT head normal. Pt within 6hr window, do not think LP or further testing indicated at this time. Will try migraine cocktail.   12:17 AM Pt feeling better. Her slowed speech and tremor is gone. Headache resolved. Pt will be d/c home with neurology follow up. Most likely complex migraine.   Filed Vitals:   12/26/13 1901 12/26/13 2115 12/27/13 0010  BP: 118/77 131/79 123/64  Pulse: 92 81 86  Temp: 98.6 F (37 C)    TempSrc: Oral    Resp: 16 18 18   SpO2: 100% 100% 98%      Denni France A Kennth Vanbenschoten, PA-C 12/27/13 0022

## 2013-12-26 NOTE — ED Notes (Signed)
Patient transported to CT 

## 2013-12-27 ENCOUNTER — Emergency Department (HOSPITAL_COMMUNITY)
Admission: EM | Admit: 2013-12-27 | Discharge: 2013-12-27 | Disposition: A | Discharge status: Home / Self Care | Payer: BC Managed Care – HMO | Attending: Emergency Medicine | Admitting: Emergency Medicine

## 2013-12-27 ENCOUNTER — Encounter (HOSPITAL_COMMUNITY): Payer: Self-pay | Admitting: Emergency Medicine

## 2013-12-27 ENCOUNTER — Emergency Department (HOSPITAL_COMMUNITY): Payer: BC Managed Care – HMO

## 2013-12-27 DIAGNOSIS — R251 Tremor, unspecified: Secondary | ICD-10-CM | POA: Insufficient documentation

## 2013-12-27 DIAGNOSIS — R200 Anesthesia of skin: Secondary | ICD-10-CM | POA: Diagnosis not present

## 2013-12-27 DIAGNOSIS — Z862 Personal history of diseases of the blood and blood-forming organs and certain disorders involving the immune mechanism: Secondary | ICD-10-CM | POA: Insufficient documentation

## 2013-12-27 DIAGNOSIS — Z8759 Personal history of other complications of pregnancy, childbirth and the puerperium: Secondary | ICD-10-CM | POA: Diagnosis not present

## 2013-12-27 DIAGNOSIS — Z91040 Latex allergy status: Secondary | ICD-10-CM | POA: Diagnosis not present

## 2013-12-27 DIAGNOSIS — R51 Headache: Secondary | ICD-10-CM | POA: Diagnosis present

## 2013-12-27 DIAGNOSIS — G8191 Hemiplegia, unspecified affecting right dominant side: Secondary | ICD-10-CM

## 2013-12-27 DIAGNOSIS — R479 Unspecified speech disturbances: Secondary | ICD-10-CM

## 2013-12-27 DIAGNOSIS — Z880 Allergy status to penicillin: Secondary | ICD-10-CM | POA: Insufficient documentation

## 2013-12-27 DIAGNOSIS — F439 Reaction to severe stress, unspecified: Secondary | ICD-10-CM | POA: Insufficient documentation

## 2013-12-27 DIAGNOSIS — F430 Acute stress reaction: Secondary | ICD-10-CM

## 2013-12-27 DIAGNOSIS — G8190 Hemiplegia, unspecified affecting unspecified side: Secondary | ICD-10-CM

## 2013-12-27 MED ORDER — LORAZEPAM 2 MG/ML IJ SOLN
INTRAMUSCULAR | Status: AC
  Filled 2013-12-27: qty 1

## 2013-12-27 MED ORDER — LORAZEPAM 1 MG PO TABS: 1.0000 mg | ORAL_TABLET | Freq: Three times a day (TID) | ORAL | Status: DC | PRN

## 2013-12-27 MED ORDER — LORAZEPAM 2 MG/ML IJ SOLN
2.0000 mg | Freq: Once | INTRAMUSCULAR | Status: AC
  Administered 2013-12-27: 2 mg via INTRAMUSCULAR

## 2013-12-27 NOTE — Consult Note (Signed)
Reason for Consult:Difficulty with speech and right sided numbness Referring Physician: Radford PaxBeaton  CC: Difficulty with speech and right sided weakness  HPI: Patricia Macdonald is an 28 y.o. female who reports that on yesterday she developed a sharp pain in the back of her head.  She then developed pain on the left side of her face and left facial numbness.  Her speech then became difficult.  At first was unable to get her words out correctly and her speech was slurred.  Later she was unable to speak at all.  She then developed a tightness on her right and shaking in the right upper extremity.  At rest patient would also at times have an all over body jerk.  She was taken to the ED by EMS last evening and treated.  Her head pain and numbness resolved but the right sided symptoms continued.  She was able to sleep last evening and her husband reports there was no jerking or tremors in her sleep.  Patient reports no stressors.    Past Medical History  Diagnosis Date  . Anemia   . Abnormal Pap smear of cervix   . Blood transfusion 2005  . Postpartum hemorrhage 09/2003    Past Surgical History  Procedure Laterality Date  . Colposcopy      Family History  Problem Relation Age of Onset  . Diabetes Mother     Social History:  reports that she has never smoked. She does not have any smokeless tobacco history on file. She reports that she does not drink alcohol or use illicit drugs.  Allergies  Allergen Reactions  . Penicillins Anaphylaxis  . Latex Hives and Swelling  . Percocet [Oxycodone-Acetaminophen] Nausea Only    Patient can tolerate acetaminophen solely    Medications: I have reviewed the patient's current medications. Prior to Admission:  No current outpatient prescriptions on file.  ROS: History obtained from the patient  General ROS: negative for - chills, fatigue, fever, night sweats, weight gain or weight loss Psychological ROS: negative for - behavioral disorder,  hallucinations, memory difficulties, mood swings or suicidal ideation Ophthalmic ROS: negative for - blurry vision, double vision, eye pain or loss of vision ENT ROS: negative for - epistaxis, nasal discharge, oral lesions, sore throat, tinnitus or vertigo Allergy and Immunology ROS: negative for - hives or itchy/watery eyes Hematological and Lymphatic ROS: negative for - bleeding problems, bruising or swollen lymph nodes Endocrine ROS: negative for - galactorrhea, hair pattern changes, polydipsia/polyuria or temperature intolerance Respiratory ROS: negative for - cough, hemoptysis, shortness of breath or wheezing Cardiovascular ROS: negative for - chest pain, dyspnea on exertion, edema or irregular heartbeat Gastrointestinal ROS: negative for - abdominal pain, diarrhea, hematemesis, nausea/vomiting or stool incontinence Genito-Urinary ROS: negative for - dysuria, hematuria, incontinence or urinary frequency/urgency Musculoskeletal ROS: negative for - joint swelling or muscular weakness Neurological ROS: as noted in HPI Dermatological ROS: negative for rash and skin lesion changes  Physical Examination: Blood pressure 129/73, pulse 83, temperature 99.2 F (37.3 C), temperature source Oral, resp. rate 17, last menstrual period 12/03/2013, SpO2 99.00%, unknown if currently breastfeeding.  Neurologic Examination Mental Status: Alert, oriented, thought content appropriate.  Speech fluent without evidence of aphasia.  Patient's speech is infantile and stuttering.  Once patient relaxes is able to speak more normally.  Able to follow 3 step commands without difficulty.  Cries often during examination.   Cranial Nerves: II: Discs flat bilaterally; Visual fields grossly normal, pupils equal, round, reactive to light and  accommodation III,IV, VI: ptosis not present, extra-ocular motions intact bilaterally V,VII: smile symmetric, facial light touch sensation decreased on the left.  Vibratory sensation  decreased on the left when tuning fork placed in center of forehead.   VIII: hearing normal bilaterally IX,X: gag reflex present XI: bilateral shoulder shrug XII: midline tongue extension Motor: Right : Upper extremity   5/5    Left:     Upper extremity   5/5  Lower extremity   5/5     Lower extremity   5/5 Tone and bulk:normal tone throughout; no atrophy noted.  Shaking noted of the RUE and at times both upper extremities.  With distraction no tremor noted.   Sensory: Pinprick and light touch decreased on the right upper and lower extremity.  When tuning fork placed on center of chest patient unable to feel vibration well on the right.   Deep Tendon Reflexes: 2+ and symmetric throughout Plantars: Right: downgoing   Left: downgoing Cerebellar: normal finger-to-nose and normal heel-to-shin testing bilaterally with coarse tremor on the right that stopped when patient placed her fingertip on my fingertip.   Gait: Patient stooped with gait but otherwise gait normal.   CV: pulses palpable throughout     Laboratory Studies:   Basic Metabolic Panel: No results found for this basename: NA, K, CL, CO2, GLUCOSE, BUN, CREATININE, CALCIUM, MG, PHOS,  in the last 168 hours  Liver Function Tests: No results found for this basename: AST, ALT, ALKPHOS, BILITOT, PROT, ALBUMIN,  in the last 168 hours No results found for this basename: LIPASE, AMYLASE,  in the last 168 hours No results found for this basename: AMMONIA,  in the last 168 hours  CBC: No results found for this basename: WBC, NEUTROABS, HGB, HCT, MCV, PLT,  in the last 168 hours  Cardiac Enzymes: No results found for this basename: CKTOTAL, CKMB, CKMBINDEX, TROPONINI,  in the last 168 hours  BNP: No components found with this basename: POCBNP,   CBG: No results found for this basename: GLUCAP,  in the last 168 hours  Microbiology: Results for orders placed during the hospital encounter of 03/08/13  WET PREP, GENITAL     Status:  Abnormal   Collection Time    03/08/13  4:10 PM      Result Value Ref Range Status   Yeast Wet Prep HPF POC NONE SEEN  NONE SEEN Final   Trich, Wet Prep NONE SEEN  NONE SEEN Final   Clue Cells Wet Prep HPF POC NONE SEEN  NONE SEEN Final   WBC, Wet Prep HPF POC FEW (*) NONE SEEN Final   Comment: FEW BACTERIA SEEN  GC/CHLAMYDIA PROBE AMP     Status: None   Collection Time    03/08/13  4:10 PM      Result Value Ref Range Status   CT Probe RNA NEGATIVE  NEGATIVE Final   GC Probe RNA NEGATIVE  NEGATIVE Final   Comment: (NOTE)                                                                                               **  Normal Reference Range: Negative**          Assay performed using the Gen-Probe APTIMA COMBO2 (R) Assay.     Acceptable specimen types for this assay include APTIMA Swabs (Unisex,     endocervical, urethral, or vaginal), first void urine, and ThinPrep     liquid based cytology samples.     Performed at [facility]    Coagulation Studies: No results found for this basename: LABPROT, INR,  in the last 72 hours  Urinalysis: No results found for this basename: COLORURINE, APPERANCEUR, LABSPEC, PHURINE, GLUCOSEU, HGBUR, BILIRUBINUR, KETONESUR, PROTEINUR, UROBILINOGEN, NITRITE, LEUKOCYTESUR,  in the last 168 hours  Lipid Panel:  No results found for this basename: chol,  trig,  hdl,  cholhdl,  vldl,  ldlcalc    HgbA1C:  No results found for this basename: HGBA1C    Urine Drug Screen:   No results found for this basename: labopia,  cocainscrnur,  labbenz,  amphetmu,  thcu,  labbarb    Alcohol Level: No results found for this basename: ETH,  in the last 168 hours   Imaging: Ct Head Wo Contrast  12/26/2013   CLINICAL DATA:  Headache and neck pain.  Initial encounter  EXAM: CT HEAD WITHOUT CONTRAST  TECHNIQUE: Contiguous axial images were obtained from the base of the skull through the vertex without intravenous contrast.  COMPARISON:  None.  FINDINGS: Skull  and Sinuses:Negative for fracture or destructive process. The mastoids, middle ears, and imaged paranasal sinuses are clear.  Orbits: No acute abnormality.  Brain: No evidence of acute abnormality, such as acute infarction, hemorrhage, hydrocephalus, or mass lesion/mass effect.  IMPRESSION: Negative head CT.   Electronically Signed   By: Tiburcio PeaJonathan  Watts M.D.   On: 12/26/2013 23:06     Assessment/Plan: 28 year old female presenting with multiple complaints including stuttering speech, left facial numbness, right upper and lower extremity numbness and right upper extremity tremor and increased tone. Neurological examination has multiple functional features.  Concerned this may be related to stressors.  Head CT reviewed from last night and shows no abnormalities.  Would like to rule out a underlying neurolgical issue with further imaging.  Recommendations 1.  TSH 2.   MRI of the brain without contrast.  If MRI of the brain is unremarkable, no further neurological evaluation is recommended at this time.    Case discussed with Dr. Henreitta LeberBeaton   Jayanth Szczesniak, MD [facility] [phone] 12/27/2013, 10:30 PM

## 2013-12-27 NOTE — ED Provider Notes (Signed)
Medical screening examination/treatment/procedure(s) were performed by non-physician practitioner and as supervising physician I was immediately available for consultation/collaboration.   EKG Interpretation None        Tanvi Gatling J. Trequan Marsolek, MD 12/27/13 0023 

## 2013-12-27 NOTE — ED Notes (Signed)
Pt reports being at work yesterday, had onset of pain to back of head, speech began slow and stuttering. Pt had numbness to left side of body and then tremors to right side. [facility] as [facility], had improvement after  meds and then dc home. Pt reports some improvement but still has speech impairment and twitching/tremors to right side.

## 2013-12-27 NOTE — Discharge Instructions (Signed)
Stress Stress-related medical problems are becoming increasingly common. The body has a built-in physical response to stressful situations. Faced with pressure, challenge or danger, we need to react quickly. Our bodies release hormones such as cortisol and adrenaline to help do this. These hormones are part of the "fight or flight" response and affect the metabolic rate, heart rate and blood pressure, resulting in a heightened, stressed state that prepares the body for optimum performance in dealing with a stressful situation. It is likely that early man required these mechanisms to stay alive, but usually modern stresses do not call for this, and the same hormones released in today's world can damage health and reduce coping ability. CAUSES  Pressure to perform at work, at school or in sports.  Threats of physical violence.  Money worries.  Arguments.  Family conflicts.  Divorce or separation from significant other.  Bereavement.  New job or unemployment.  Changes in location.  Alcohol or drug abuse. SOMETIMES, THERE IS NO PARTICULAR REASON FOR DEVELOPING STRESS. Almost all people are at risk of being stressed at some time in their lives. It is important to know that some stress is temporary and some is long term.  Temporary stress will go away when a situation is resolved. Most people can cope with short periods of stress, and it can often be relieved by relaxing, taking a walk or getting any type of exercise, chatting through issues with friends, or having a good night's sleep.  Chronic (long-term, continuous) stress is much harder to deal with. It can be psychologically and emotionally damaging. It can be harmful both for an individual and for friends and family. SYMPTOMS Everyone reacts to stress differently. There are some common effects that help us recognize it. In times of extreme stress, people may:  Shake uncontrollably.  Breathe faster and deeper than normal  (hyperventilate).  Vomit.  For people with asthma, stress can trigger an attack.  For some people, stress may trigger migraine headaches, ulcers, and body pain. PHYSICAL EFFECTS OF STRESS MAY INCLUDE:  Loss of energy.  Skin problems.  Aches and pains resulting from tense muscles, including neck ache, backache and tension headaches.  Increased pain from arthritis and other conditions.  Irregular heart beat (palpitations).  Periods of irritability or anger.  Apathy or depression.  Anxiety (feeling uptight or worrying).  Unusual behavior.  Loss of appetite.  Comfort eating.  Lack of concentration.  Loss of, or decreased, sex-drive.  Increased smoking, drinking, or recreational drug use.  For women, missed periods.  Ulcers, joint pain, and muscle pain. Post-traumatic stress is the stress caused by any serious accident, strong emotional damage, or extremely difficult or violent experience such as rape or war. Post-traumatic stress victims can experience mixtures of emotions such as fear, shame, depression, guilt or anger. It may include recurrent memories or images that may be haunting. These feelings can last for weeks, months or even years after the traumatic event that triggered them. Specialized treatment, possibly with medicines and psychological therapies, is available. If stress is causing physical symptoms, severe distress or making it difficult for you to function as normal, it is worth seeing your caregiver. It is important to remember that although stress is a usual part of life, extreme or prolonged stress can lead to other illnesses that will need treatment. It is better to visit a doctor sooner rather than later. Stress has been linked to the development of high blood pressure and heart disease, as well as insomnia and depression.   There is no diagnostic test for stress since everyone reacts to it differently. But a caregiver will be able to spot the physical  symptoms, such as:  Headaches.  Shingles.  Ulcers. Emotional distress such as intense worry, low mood or irritability should be detected when the doctor asks pertinent questions to identify any underlying problems that might be the cause. In case there are physical reasons for the symptoms, the doctor may also want to do some tests to exclude certain conditions. If you feel that you are suffering from stress, try to identify the aspects of your life that are causing it. Sometimes you may not be able to change or avoid them, but even a small change can have a positive ripple effect. A simple lifestyle change can make all the difference. STRATEGIES THAT CAN HELP DEAL WITH STRESS:  Delegating or sharing responsibilities.  Avoiding confrontations.  Learning to be more assertive.  Regular exercise.  Avoid using alcohol or street drugs to cope.  Eating a healthy, balanced diet, rich in fruit and vegetables and proteins.  Finding humor or absurdity in stressful situations.  Never taking on more than you know you can handle comfortably.  Organizing your time better to get as much done as possible.  Talking to friends or family and sharing your thoughts and fears.  Listening to music or relaxation tapes.  Relaxation techniques like deep breathing, meditation, and yoga.  Tensing and then relaxing your muscles, starting at the toes and working up to the head and neck. If you think that you would benefit from help, either in identifying the things that are causing your stress or in learning techniques to help you relax, see a caregiver who is capable of helping you with this. Rather than relying on medications, it is usually better to try and identify the things in your life that are causing stress and try to deal with them. There are many techniques of managing stress including counseling, psychotherapy, aromatherapy, yoga, and exercise. Your caregiver can help you determine what is best  for you. Document Released: 05/10/2002 Document Revised: 02/22/2013 Document Reviewed: 04/06/2007 ExitCare Patient Information 2015 [employer]. This information is not intended to replace advice given to you by your health care provider. Make sure you discuss any questions you have with your health care provider.  

## 2013-12-27 NOTE — ED Provider Notes (Signed)
CSN: [id number]     Arrival date & time 12/27/13  1700 History   First MD Initiated Contact with Patient 12/27/13 2012     Chief Complaint  Patient presents with  . Headache  . Tremors     HPI Pt reports being at work yesterday, had onset of pain to back of head, speech began slow and stuttering. Pt had numbness to left side of body and then tremors to right side. [facility] as complex migraine, had improvement after meds and then dc home. Pt reports some improvement but still has speech impairment and twitching/tremors to right side.  Past Medical History  Diagnosis Date  . Anemia   . Abnormal Pap smear of cervix   . Blood transfusion 2005  . Postpartum hemorrhage 09/2003   Past Surgical History  Procedure Laterality Date  . Colposcopy     Family History  Problem Relation Age of Onset  . Diabetes Mother    History  Substance Use Topics  . Smoking status: Never Smoker   . Smokeless tobacco: Not on file  . Alcohol Use: No   OB History   Grav Para Term Preterm Abortions TAB SAB Ect Mult Living   6 4 4  0 2  2   4      Review of Systems  All other systems reviewed and are negative  Allergies  Penicillins; Latex; and Percocet  Home Medications   Prior to Admission medications   Medication Sig Start Date End Date Taking? Authorizing Provider  LORazepam (ATIVAN) 1 MG tablet Take 1 tablet (1 mg total) by mouth every 8 (eight) hours as needed for anxiety. 12/27/13   Nelia Shiobert L Barett Whidbee, MD   BP 118/66  Pulse 89  Temp(Src) 99.2 F (37.3 C) (Oral)  Resp 11  SpO2 98%  LMP 12/03/2013 Physical Exam Physical Exam  Nursing note and vitals reviewed. Constitutional: She is oriented to person, place, and time. She appears well-developed and well-nourished. No distress.  HENT:  Head: Normocephalic and atraumatic.  Eyes: Pupils are equal, round, and reactive to light.  Neck: Normal range of motion.  Cardiovascular: Normal rate and intact distal pulses.    Pulmonary/Chest: No respiratory distress.  Abdominal: Normal appearance. She exhibits no distension.  Musculoskeletal: Normal range of motion.  Neurological: She is alert and oriented to person, place, and time. No cranial nerve deficit.  patient has equal grip strength bilaterally has what appears to be a functional tremor on the right.  No gross cranial nerve deficit.  Speech pattern is stuttering and appears also to be functional.  At times it seems to get better. Skin: Skin is warm and dry. No rash noted.  Psychiatric: Patient appears anxious.    ED Course  Procedures (including critical care time) Medications  LORazepam (ATIVAN) 2 MG/ML injection (  Duplicate 12/27/13 2111)  LORazepam (ATIVAN) injection 2 mg (2 mg Intramuscular Given 12/27/13 2105)    Labs Review Labs Reviewed - No data to display  Imaging Review Mr Brain Wo Contrast  12/27/2013   CLINICAL DATA:  Acute onset posterior head pain beginning yesterday radiating to the LEFT face, with subsequent difficulty speaking. Subsequent tightness on RIGHT side of body with tremor in RIGHT upper extremity.  EXAM: MRI HEAD WITHOUT CONTRAST  TECHNIQUE: Multiplanar, multiecho pulse sequences of the brain and surrounding structures were obtained without intravenous contrast.  COMPARISON:  CT of the head December 26, 2013  FINDINGS: The ventricles and sulci are normal for patient's  age. No abnormal parenchymal signal, mass lesions, mass effect. No reduced diffusion to suggest acute ischemia nor hyperacute demyelination. No susceptibility artifact to suggest hemorrhage.  No abnormal extra-axial fluid collections. No extra-axial masses though, contrast enhanced sequences would be more sensitive. Normal major intracranial vascular flow voids seen at the skull base.  Ocular globes and orbital contents are unremarkable though not tailored for evaluation. No abnormal sellar expansion. Visualized paranasal sinuses and mastoid air cells are  well-aerated. No suspicious calvarial bone marrow signal. No abnormal sellar expansion. Craniocervical junction maintained.  IMPRESSION: No acute intracranial process; normal noncontrast MRI of the brain.   Electronically Signed   By: Awilda Metroourtnay  Bloomer   On: 12/27/2013 22:54    Patient was seen and evaluated by neurology (Dr. Thad Rangereynolds).  She felt her symptoms were probably related to stress and not related to pathologic disease.  Recommended an MRI scan.  MR was negative.  Patient doing better in stiffness and right arm is gone.  Speech is much improved.  Patient and husband okay to go home.  Will start on Ativan when necessary and follow-up with behavioral health.  MDM   Final diagnoses:  Stress disorder, acute        Nelia Shiobert L Mariaceleste Herrera, MD 12/29/13 1408

## 2013-12-27 NOTE — Discharge Instructions (Signed)
Try Excedrin migraine for headache if reoccurs. Follow up with neurology or headache wellness center for recheck, further evaluation and treatment. Return if worsening.   Migraine Headache A migraine headache is an intense, throbbing pain on one or both sides of your head. A migraine can last for 30 minutes to several hours. CAUSES  The exact cause of a migraine headache is not always known. However, a migraine may be caused when nerves in the brain become irritated and release chemicals that cause inflammation. This causes pain. Certain things may also trigger migraines, such as:  Alcohol.  Smoking.  Stress.  Menstruation.  Aged cheeses.  Foods or drinks that contain nitrates, glutamate, aspartame, or tyramine.  Lack of sleep.  Chocolate.  Caffeine.  Hunger.  Physical exertion.  Fatigue.  Medicines used to treat chest pain (nitroglycerine), birth control pills, estrogen, and some blood pressure medicines. SIGNS AND SYMPTOMS  Pain on one or both sides of your head.  Pulsating or throbbing pain.  Severe pain that prevents daily activities.  Pain that is aggravated by any physical activity.  Nausea, vomiting, or both.  Dizziness.  Pain with exposure to bright lights, loud noises, or activity.  General sensitivity to bright lights, loud noises, or smells. Before you get a migraine, you may get warning signs that a migraine is coming (aura). An aura may include:  Seeing flashing lights.  Seeing bright spots, halos, or zigzag lines.  Having tunnel vision or blurred vision.  Having feelings of numbness or tingling.  Having trouble talking.  Having muscle weakness. DIAGNOSIS  A migraine headache is often diagnosed based on:  Symptoms.  Physical exam.  A CT scan or MRI of your head. These imaging tests cannot diagnose migraines, but they can help rule out other causes of headaches. TREATMENT Medicines may be given for pain and nausea. Medicines can also  be given to help prevent recurrent migraines.  HOME CARE INSTRUCTIONS  Only take over-the-counter or prescription medicines for pain or discomfort as directed by your health care provider. The use of long-term narcotics is not recommended.  Lie down in a dark, quiet room when you have a migraine.  Keep a journal to find out what may trigger your migraine headaches. For example, write down:  What you eat and drink.  How much sleep you get.  Any change to your diet or medicines.  Limit alcohol consumption.  Quit smoking if you smoke.  Get 7-9 hours of sleep, or as recommended by your health care provider.  Limit stress.  Keep lights dim if bright lights bother you and make your migraines worse. SEEK IMMEDIATE MEDICAL CARE IF:   Your migraine becomes severe.  You have a fever.  You have a stiff neck.  You have vision loss.  You have muscular weakness or loss of muscle control.  You start losing your balance or have trouble walking.  You feel faint or pass out.  You have severe symptoms that are different from your first symptoms. MAKE SURE YOU:   Understand these instructions.  Will watch your condition.  Will get help right away if you are not doing well or get worse. Document Released: 02/17/2005 Document Revised: 07/04/2013 Document Reviewed: 10/25/2012 [facility] Patient Information 2015 [place] [place]. This information is not intended to replace advice given to you by your health care provider. Make sure you discuss any questions you have with your health care provider.

## 2013-12-27 NOTE — ED Notes (Signed)
Neurologist at bedside. 

## 2013-12-30 ENCOUNTER — Telehealth (HOSPITAL_COMMUNITY): Payer: Self-pay

## 2014-01-02 ENCOUNTER — Encounter (HOSPITAL_COMMUNITY): Payer: Self-pay | Admitting: Emergency Medicine

## 2014-01-03 DIAGNOSIS — D649 Anemia, unspecified: Secondary | ICD-10-CM | POA: Insufficient documentation

## 2014-01-10 ENCOUNTER — Emergency Department (HOSPITAL_COMMUNITY): Payer: BC Managed Care – HMO

## 2014-01-10 ENCOUNTER — Encounter (HOSPITAL_COMMUNITY): Payer: Self-pay | Admitting: *Deleted

## 2014-01-10 ENCOUNTER — Emergency Department (HOSPITAL_COMMUNITY)
Admission: EM | Admit: 2014-01-10 | Discharge: 2014-01-11 | Disposition: A | Discharge status: Home / Self Care | Payer: BC Managed Care – HMO | Attending: Emergency Medicine | Admitting: Emergency Medicine

## 2014-01-10 DIAGNOSIS — R0789 Other chest pain: Secondary | ICD-10-CM | POA: Diagnosis not present

## 2014-01-10 DIAGNOSIS — Z3202 Encounter for pregnancy test, result negative: Secondary | ICD-10-CM | POA: Insufficient documentation

## 2014-01-10 DIAGNOSIS — R079 Chest pain, unspecified: Secondary | ICD-10-CM | POA: Diagnosis present

## 2014-01-10 DIAGNOSIS — G43109 Migraine with aura, not intractable, without status migrainosus: Secondary | ICD-10-CM | POA: Diagnosis not present

## 2014-01-10 DIAGNOSIS — Z91010 Allergy to peanuts: Secondary | ICD-10-CM | POA: Diagnosis not present

## 2014-01-10 DIAGNOSIS — Z862 Personal history of diseases of the blood and blood-forming organs and certain disorders involving the immune mechanism: Secondary | ICD-10-CM | POA: Diagnosis not present

## 2014-01-10 DIAGNOSIS — Z880 Allergy status to penicillin: Secondary | ICD-10-CM | POA: Insufficient documentation

## 2014-01-10 LAB — BASIC METABOLIC PANEL
ANION GAP: 13 (ref 5–15)
BUN: 11 mg/dL (ref 6–23)
CO2: 25 meq/L (ref 19–32)
CREATININE: 0.85 mg/dL (ref 0.50–1.10)
Calcium: 9.4 mg/dL (ref 8.4–10.5)
Chloride: 105 mEq/L (ref 96–112)
Glucose, Bld: 90 mg/dL (ref 70–99)
Potassium: 3.8 mEq/L (ref 3.7–5.3)
SODIUM: 143 meq/L (ref 137–147)

## 2014-01-10 LAB — CBC
HCT: 34.3 % — ABNORMAL LOW (ref 36.0–46.0)
Hemoglobin: 10.4 g/dL — ABNORMAL LOW (ref 12.0–15.0)
MCH: 21.9 pg — ABNORMAL LOW (ref 26.0–34.0)
MCHC: 30.3 g/dL (ref 30.0–36.0)
MCV: 72.4 fL — ABNORMAL LOW (ref 78.0–100.0)
PLATELETS: 328 10*3/uL (ref 150–400)
RBC: 4.74 MIL/uL (ref 3.87–5.11)
RDW: 17.4 % — ABNORMAL HIGH (ref 11.5–15.5)
WBC: 6.2 10*3/uL (ref 4.0–10.5)

## 2014-01-10 LAB — I-STAT TROPONIN, ED: TROPONIN I, POC: 0.01 ng/mL (ref 0.00–0.08)

## 2014-01-10 LAB — POC URINE PREG, ED: Preg Test, Ur: NEGATIVE

## 2014-01-10 MED ORDER — METOCLOPRAMIDE HCL 5 MG/ML IJ SOLN
10.0000 mg | Freq: Once | INTRAMUSCULAR | Status: AC
  Administered 2014-01-10: 10 mg via INTRAVENOUS
  Filled 2014-01-10: qty 2

## 2014-01-10 MED ORDER — BUTALBITAL-APAP-CAFFEINE 50-325-40 MG PO TABS: 1.0000 | ORAL_TABLET | Freq: Four times a day (QID) | ORAL | Status: AC | PRN

## 2014-01-10 MED ORDER — DIPHENHYDRAMINE HCL 50 MG/ML IJ SOLN
25.0000 mg | Freq: Once | INTRAMUSCULAR | Status: AC
  Administered 2014-01-10: 25 mg via INTRAVENOUS
  Filled 2014-01-10: qty 1

## 2014-01-10 MED ORDER — SODIUM CHLORIDE 0.9 % IV BOLUS (SEPSIS)
1000.0000 mL | Freq: Once | INTRAVENOUS | Status: AC
  Administered 2014-01-10: 1000 mL via INTRAVENOUS

## 2014-01-10 MED ORDER — KETOROLAC TROMETHAMINE 30 MG/ML IJ SOLN
30.0000 mg | Freq: Once | INTRAMUSCULAR | Status: AC
  Administered 2014-01-10: 25 mg via INTRAVENOUS
  Filled 2014-01-10: qty 1

## 2014-01-10 MED ORDER — LORAZEPAM 2 MG/ML IJ SOLN
1.0000 mg | Freq: Once | INTRAMUSCULAR | Status: DC
  Filled 2014-01-10: qty 1

## 2014-01-10 NOTE — ED Notes (Signed)
Pt becoming restless and anxious. Ward, MD notified and at bedside.

## 2014-01-10 NOTE — ED Notes (Signed)
Pt is calm. Pt states that she is sleepy.

## 2014-01-10 NOTE — ED Notes (Signed)
Pt ambulatory to bathroom with steady gait.

## 2014-01-10 NOTE — ED Provider Notes (Signed)
TIME SEEN: 9:40 PM  CHIEF COMPLAINT: headache, right-sided weakness, chest pain  HPI: Pt is a 28 y.o. F with history of migraine headaches who presents to the emergency department with complaints of sharp chest pain that started earlier today that last for several seconds and then resolves. No associated shortness of breath, nausea, vomiting, diaphoresis or dizziness. Pain is worse with palpation and deep inspiration. No history of PE or DVT, lower extremity swelling or pain, recent hospitalization, fracture, surgery, trauma, tobacco use, exogenous estrogens.   She is also complaining of having a diffuse throbbing headache that appeared earlier today that has resolved. Afterwards she developed right-sided numbness. No weakness. No history of head injury. No fevers, neck pain or neck stiffness. She has had similar neurologic deficits on October 27 and had a negative MRI of her brain. Symptoms thought secondary to a complicated migraine.  ROS: See HPI Constitutional: no fever  Eyes: no drainage  ENT: no runny nose   Cardiovascular:  no chest pain  Resp: no SOB  GI: no vomiting GU: no dysuria Integumentary: no rash  Allergy: no hives  Musculoskeletal: no leg swelling  Neurological: no slurred speech ROS otherwise negative  PAST MEDICAL HISTORY/PAST SURGICAL HISTORY:  Past Medical History  Diagnosis Date  . Anemia   . Abnormal Pap smear of cervix   . Blood transfusion 2005  . Postpartum hemorrhage 09/2003    MEDICATIONS:  Prior to Admission medications   Medication Sig Start Date End Date Taking? Authorizing Provider  LORazepam (ATIVAN) 1 MG tablet Take 1 tablet (1 mg total) by mouth every 8 (eight) hours as needed for anxiety. 12/27/13  Yes Nelia Shiobert L Beaton, MD    ALLERGIES:  Allergies  Allergen Reactions  . Penicillins Anaphylaxis  . Latex Hives and Swelling  . Percocet [Oxycodone-Acetaminophen] Nausea Only    Patient can tolerate acetaminophen solely    SOCIAL HISTORY:   History  Substance Use Topics  . Smoking status: Never Smoker   . Smokeless tobacco: Not on file  . Alcohol Use: No    FAMILY HISTORY: Family History  Problem Relation Age of Onset  . Diabetes Mother     EXAM: BP 113/77 mmHg  Pulse 74  Temp(Src) 98.1 F (36.7 C) (Oral)  Resp 15  Ht 5' 5.5" (1.664 m)  Wt 196 lb (88.905 kg)  BMI 32.11 kg/m2  SpO2 100%  LMP 12/27/2013 CONSTITUTIONAL: Alert and oriented and responds appropriately to questions. Well-appearing; well-nourished HEAD: Normocephalic EYES: Conjunctivae clear, PERRL ENT: normal nose; no rhinorrhea; moist mucous membranes; pharynx without lesions noted NECK: Supple, no meningismus, no LAD  CARD: RRR; S1 and S2 appreciated; no murmurs, no clicks, no rubs, no gallops RESP: Normal chest excursion without splinting or tachypnea; breath sounds clear and equal bilaterally; no wheezes, no rhonchi, no rales, chest wall is tender to palpation without crepitus or ecchymosis or deformity, no flail chest ABD/GI: Normal bowel sounds; non-distended; soft, non-tender, no rebound, no guarding BACK:  The back appears normal and is non-tender to palpation, there is no CVA tenderness EXT: Normal ROM in all joints; non-tender to palpation; no edema; normal capillary refill; no cyanosis    SKIN: Normal color for age and race; warm NEURO: Moves all extremities equally; sensory deficits in the right face and right arm and right leg compared to the left, strength 5/5 in all 4 extremities, cranial nerves II through XII intact, normal gait PSYCH: The patient's mood and manner are appropriate. Grooming and personal hygiene are appropriate.  MEDICAL DECISION MAKING: Pt here with right-sided numbness. Likely complicated migraine. Will treat with Toradol, Reglan and Benadryl. Her chest pain is very atypical reproducible with palpation of her chest wall. She is PERC negative and has no risk factors for ACS. Her EKG shows no significant changes. Labs  ordered in triage including troponin are negative. Chest x-ray clear.  ED PROGRESS: After receiving Reglan, patient became very restless and appeared to be having akathisia. Was given another 25 mg of IV Benadryl and is now calm, resting.   11:45 PM  Pt's UPT is negative. She reports her numbness has resolved. Suspect complicated migraine and chest wall pain. We'll discharge home with prescription for Fioricet. Discussed return precautions. Patient verbalizes understanding and is comfortable with plan.      EKG Interpretation  Date/Time:  Tuesday January 10 2014 18:46:34 EST Ventricular Rate:  90 PR Interval:  168 QRS Duration: 80 QT Interval:  360 QTC Calculation: 440 R Axis:   76 Text Interpretation:  Normal sinus rhythm Possible Left atrial enlargement Nonspecific T wave abnormality Abnormal ECG No significant change since last tracing Confirmed by WARD,  DO, KRISTEN [phone] on 01/10/2014 9:29:49 PM          Layla MawKristen N Ward, DO 01/10/14 2346

## 2014-01-10 NOTE — ED Notes (Addendum)
Rt. Arm numbness. Hx. Of upper back pain. Feel like "pins and needles." lt. Chest wall pain. Felt like sharp pain."

## 2014-01-10 NOTE — Discharge Instructions (Signed)
You were seen in the emergency department for migraine headache that likely caused her right-sided numbness. Some people can have neurologic deficits with her migraine headaches. He was had a normal MRI of your brain on October 27. Please take your Fioricet as needed if you have a migraine headache.   Chest Wall Pain Chest wall pain is pain in or around the bones and muscles of your chest. It may take up to 6 weeks to get better. It may take longer if you must stay physically active in your work and activities.  CAUSES  Chest wall pain may happen on its own. However, it may be caused by:  A viral illness like the flu.  Injury.  Coughing.  Exercise.  Arthritis.  Fibromyalgia.  Shingles. HOME CARE INSTRUCTIONS   Avoid overtiring physical activity. Try not to strain or perform activities that cause pain. This includes any activities using your chest or your abdominal and side muscles, especially if heavy weights are used.  Put ice on the sore area.  Put ice in a plastic bag.  Place a towel between your skin and the bag.  Leave the ice on for 15-20 minutes per hour while awake for the first 2 days.  Only take over-the-counter or prescription medicines for pain, discomfort, or fever as directed by your caregiver. SEEK IMMEDIATE MEDICAL CARE IF:   Your pain increases, or you are very uncomfortable.  You have a fever.  Your chest pain becomes worse.  You have new, unexplained symptoms.  You have nausea or vomiting.  You feel sweaty or lightheaded.  You have a cough with phlegm (sputum), or you cough up blood. MAKE SURE YOU:   Understand these instructions.  Will watch your condition.  Will get help right away if you are not doing well or get worse. Document Released: 02/17/2005 Document Revised: 05/12/2011 Document Reviewed: 10/14/2010 [facility] Patient Information 2015 [place], [place]. This information is not intended to replace advice given to you by your health  care provider. Make sure you discuss any questions you have with your health care provider.  Migraine Headache A migraine headache is an intense, throbbing pain on one or both sides of your head. A migraine can last for 30 minutes to several hours. CAUSES  The exact cause of a migraine headache is not always known. However, a migraine may be caused when nerves in the brain become irritated and release chemicals that cause inflammation. This causes pain. Certain things may also trigger migraines, such as:  Alcohol.  Smoking.  Stress.  Menstruation.  Aged cheeses.  Foods or drinks that contain nitrates, glutamate, aspartame, or tyramine.  Lack of sleep.  Chocolate.  Caffeine.  Hunger.  Physical exertion.  Fatigue.  Medicines used to treat chest pain (nitroglycerine), birth control pills, estrogen, and some blood pressure medicines. SIGNS AND SYMPTOMS  Pain on one or both sides of your head.  Pulsating or throbbing pain.  Severe pain that prevents daily activities.  Pain that is aggravated by any physical activity.  Nausea, vomiting, or both.  Dizziness.  Pain with exposure to bright lights, loud noises, or activity.  General sensitivity to bright lights, loud noises, or smells. Before you get a migraine, you may get warning signs that a migraine is coming (aura). An aura may include:  Seeing flashing lights.  Seeing bright spots, halos, or zigzag lines.  Having tunnel vision or blurred vision.  Having feelings of numbness or tingling.  Having trouble talking.  Having muscle weakness. DIAGNOSIS  A migraine headache is often diagnosed based on:  Symptoms.  Physical exam.  A CT scan or MRI of your head. These imaging tests cannot diagnose migraines, but they can help rule out other causes of headaches. TREATMENT Medicines may be given for pain and nausea. Medicines can also be given to help prevent recurrent migraines.  HOME CARE  INSTRUCTIONS  Only take over-the-counter or prescription medicines for pain or discomfort as directed by your health care provider. The use of long-term narcotics is not recommended.  Lie down in a dark, quiet room when you have a migraine.  Keep a journal to find out what may trigger your migraine headaches. For example, write down:  What you eat and drink.  How much sleep you get.  Any change to your diet or medicines.  Limit alcohol consumption.  Quit smoking if you smoke.  Get 7-9 hours of sleep, or as recommended by your health care provider.  Limit stress.  Keep lights dim if bright lights bother you and make your migraines worse. SEEK IMMEDIATE MEDICAL CARE IF:   Your migraine becomes severe.  You have a fever.  You have a stiff neck.  You have vision loss.  You have muscular weakness or loss of muscle control.  You start losing your balance or have trouble walking.  You feel faint or pass out.  You have severe symptoms that are different from your first symptoms. MAKE SURE YOU:   Understand these instructions.  Will watch your condition.  Will get help right away if you are not doing well or get worse. Document Released: 02/17/2005 Document Revised: 07/04/2013 Document Reviewed: 10/25/2012 [facility] Patient Information 2015 [place], [place]. This information is not intended to replace advice given to you by your health care provider. Make sure you discuss any questions you have with your health care provider.

## 2016-02-06 ENCOUNTER — Other Ambulatory Visit: Payer: Self-pay | Admitting: Family Medicine

## 2016-02-06 DIAGNOSIS — N644 Mastodynia: Secondary | ICD-10-CM

## 2016-02-06 DIAGNOSIS — N6452 Nipple discharge: Secondary | ICD-10-CM

## 2016-02-08 ENCOUNTER — Ambulatory Visit
Admission: RE | Admit: 2016-02-08 | Discharge: 2016-02-08 | Disposition: A | Discharge status: Home / Self Care | Payer: 59 | Source: Ambulatory Visit | Attending: Family Medicine | Admitting: Family Medicine

## 2016-02-08 DIAGNOSIS — N644 Mastodynia: Secondary | ICD-10-CM

## 2016-02-08 DIAGNOSIS — N6452 Nipple discharge: Secondary | ICD-10-CM

## 2016-02-21 ENCOUNTER — Emergency Department (HOSPITAL_COMMUNITY)
Admission: EM | Admit: 2016-02-21 | Discharge: 2016-02-22 | Disposition: A | Discharge status: Home / Self Care | Payer: 59 | Attending: Emergency Medicine | Admitting: Emergency Medicine

## 2016-02-21 ENCOUNTER — Emergency Department (HOSPITAL_COMMUNITY): Payer: 59

## 2016-02-21 ENCOUNTER — Encounter (HOSPITAL_COMMUNITY): Payer: Self-pay | Admitting: Emergency Medicine

## 2016-02-21 DIAGNOSIS — Z91040 Latex allergy status: Secondary | ICD-10-CM | POA: Insufficient documentation

## 2016-02-21 DIAGNOSIS — R110 Nausea: Secondary | ICD-10-CM | POA: Insufficient documentation

## 2016-02-21 DIAGNOSIS — R1032 Left lower quadrant pain: Secondary | ICD-10-CM | POA: Diagnosis not present

## 2016-02-21 DIAGNOSIS — R1012 Left upper quadrant pain: Secondary | ICD-10-CM | POA: Diagnosis not present

## 2016-02-21 DIAGNOSIS — R109 Unspecified abdominal pain: Secondary | ICD-10-CM

## 2016-02-21 LAB — URINALYSIS, ROUTINE W REFLEX MICROSCOPIC
BILIRUBIN URINE: NEGATIVE
Bacteria, UA: NONE SEEN
Glucose, UA: NEGATIVE mg/dL
HGB URINE DIPSTICK: NEGATIVE
Ketones, ur: NEGATIVE mg/dL
Leukocytes, UA: NEGATIVE
Nitrite: NEGATIVE
Protein, ur: 30 mg/dL — AB
Specific Gravity, Urine: 1.027 (ref 1.005–1.030)
pH: 7 (ref 5.0–8.0)

## 2016-02-21 LAB — COMPREHENSIVE METABOLIC PANEL
ALBUMIN: 3.7 g/dL (ref 3.5–5.0)
ALT: 33 U/L (ref 14–54)
AST: 27 U/L (ref 15–41)
Alkaline Phosphatase: 86 U/L (ref 38–126)
Anion gap: 7 (ref 5–15)
BUN: 10 mg/dL (ref 6–20)
CALCIUM: 8.5 mg/dL — AB (ref 8.9–10.3)
CO2: 21 mmol/L — AB (ref 22–32)
CREATININE: 0.8 mg/dL (ref 0.44–1.00)
Chloride: 108 mmol/L (ref 101–111)
GFR calc Af Amer: >60 mL/min (ref >60)
GFR calc non Af Amer: >60 mL/min (ref >60)
Glucose, Bld: 149 mg/dL — ABNORMAL HIGH (ref 65–99)
Potassium: 4 mmol/L (ref 3.5–5.1)
SODIUM: 136 mmol/L (ref 135–145)
Total Bilirubin: 0.5 mg/dL (ref 0.3–1.2)
Total Protein: 7.5 g/dL (ref 6.5–8.1)

## 2016-02-21 LAB — I-STAT BETA HCG BLOOD, ED (MC, WL, AP ONLY)

## 2016-02-21 LAB — CBC
HCT: 33.6 % — ABNORMAL LOW (ref 36.0–46.0)
Hemoglobin: 10.7 g/dL — ABNORMAL LOW (ref 12.0–15.0)
MCH: 24.9 pg — ABNORMAL LOW (ref 26.0–34.0)
MCHC: 31.8 g/dL (ref 30.0–36.0)
MCV: 78.1 fL (ref 78.0–100.0)
PLATELETS: 303 10*3/uL (ref 150–400)
RBC: 4.3 MIL/uL (ref 3.87–5.11)
RDW: 15.8 % — ABNORMAL HIGH (ref 11.5–15.5)
WBC: 6.4 10*3/uL (ref 4.0–10.5)

## 2016-02-21 LAB — LIPASE, BLOOD: Lipase: 28 U/L (ref 11–51)

## 2016-02-21 MED ORDER — SODIUM CHLORIDE 0.9 % IV BOLUS (SEPSIS)
1000.0000 mL | Freq: Once | INTRAVENOUS | Status: AC
  Administered 2016-02-21: 1000 mL via INTRAVENOUS

## 2016-02-21 MED ORDER — MORPHINE SULFATE (PF) 2 MG/ML IV SOLN
2.0000 mg | Freq: Once | INTRAVENOUS | Status: AC
  Administered 2016-02-21: 2 mg via INTRAVENOUS
  Filled 2016-02-21: qty 1

## 2016-02-21 MED ORDER — ONDANSETRON HCL 4 MG/2ML IJ SOLN
4.0000 mg | Freq: Once | INTRAMUSCULAR | Status: AC
  Administered 2016-02-21: 4 mg via INTRAVENOUS
  Filled 2016-02-21: qty 2

## 2016-02-21 MED ORDER — ONDANSETRON 4 MG PO TBDP
4.0000 mg | ORAL_TABLET | Freq: Once | ORAL | Status: AC | PRN
  Administered 2016-02-21: 4 mg via ORAL
  Filled 2016-02-21: qty 1

## 2016-02-21 MED ORDER — IOPAMIDOL (ISOVUE-300) INJECTION 61%
INTRAVENOUS | Status: DC
Start: 2016-02-21 — End: 2016-02-22
  Filled 2016-02-21: qty 100

## 2016-02-21 NOTE — ED Triage Notes (Signed)
Patient is complaining of abdominal pain in the upper portion. Patient is having problems having a bowel movement. Patient did have a bowel movement today. Patient abdomen is distended and has more tenderness on her left upper and lower quadrants. Patient has not vomited but can not hardly eat anything due to the nausea and abdominal pain.

## 2016-02-21 NOTE — ED Provider Notes (Signed)
[facility]-EMERGENCY DEPT Provider Note   CSN: [id number] Arrival date & time: 02/21/16  2013  By signing my name below, I, Patricia Macdonald, attest that this documentation has been prepared under the direction and in the presence of Arvilla MeresAshley Meyer, PA-C.  Electronically Signed: Rosario AdieWilliam Andrew Macdonald, ED Scribe. 02/21/16. 10:26 PM.  History   Chief Complaint Chief Complaint  Patient presents with  . Nausea  . Abdominal Pain   The history is provided by the patient. No language interpreter was used.   HPI Comments: Patricia Macdonald is a 30 y.o. female with no pertinent PMHx, who presents to the Emergency Department complaining of gradually worsening, intermittent left-sided abdominal pain onset approximately 4.5 hours ago. She describes her pain as sharp and notes that she was laying down during the onset of her pain. Pt states that just prior to the onset of her pain that she noticed that her abdomen was distended, and upon laying down she became increasingly nauseous and experienced moderate diaphoresis and chills. Her abdominal pain began soon after while she was still laying down. She states that her nausea, diaphoresis, and chills has been intermittent as well with her abdominal pain since onset. She additionally notes that she has had loss of appetite due to her nausea. Her last normal bowel movement was earlier in the day, and she denies stool being bloody. No h/o similar symptoms. No prior surgical history to the abdominal. No h/o Crohn's disease, ulcerative colitis, or IBS. She denies vomiting, fever, hematuria, dysuria, syncope, rash, vaginal bleeding/discharge, shortness of breath, chest pain, leg swelling, or any other associated symptoms.   Past Medical History:  Diagnosis Date  . Abnormal Pap smear of cervix   . Anemia   . Blood transfusion 2005  . Postpartum hemorrhage 09/2003   Patient Active Problem List   Diagnosis Date Noted  . Folliculitis 11/20/2010   Past Surgical  History:  Procedure Laterality Date  . COLPOSCOPY     OB History    Gravida Para Term Preterm AB Living   6 4 4  0 2 4   SAB TAB Ectopic Multiple Live Births   2       1     Home Medications    Prior to Admission medications   Medication Sig Start Date End Date Taking? Authorizing Provider  iron polysaccharides (NIFEREX) 150 MG capsule Take 150 mg by mouth 2 (two) times daily.  07/02/15 07/01/16 Yes Historical Provider, MD  naproxen (NAPROSYN) 500 MG tablet Take 1 tablet (500 mg total) by mouth 2 (two) times daily. 02/22/16   Lona KettleAshley Laurel Meyer, PA-C  ondansetron (ZOFRAN ODT) 4 MG disintegrating tablet Take 1 tablet (4 mg total) by mouth every 8 (eight) hours as needed for nausea or vomiting. 02/22/16   Lona KettleAshley Laurel Meyer, PA-C   Family History Family History  Problem Relation Age of Onset  . Diabetes Mother    Social History Social History  Substance Use Topics  . Smoking status: Never Smoker  . Smokeless tobacco: Never Used  . Alcohol use No   Allergies   Penicillins; Latex; Percocet [oxycodone-acetaminophen]; and Reglan [metoclopramide]  Review of Systems Review of Systems  Constitutional: Positive for appetite change, chills and diaphoresis. Negative for fever.  Respiratory: Negative for shortness of breath.   Cardiovascular: Negative for chest pain and leg swelling.  Gastrointestinal: Positive for abdominal distention, abdominal pain and nausea. Negative for blood in stool, constipation, diarrhea and vomiting.  Genitourinary: Negative for dysuria, hematuria, vaginal bleeding and  vaginal discharge.  Skin: Negative for rash.  Neurological: Negative for syncope.  All other systems reviewed and are negative.  Physical Exam Updated Vital Signs BP 128/65 (BP Location: Left Arm)   Pulse 66   Temp 98.7 F (37.1 C) (Oral)   Resp 18   Ht 5\' 5"  (1.651 m)   Wt 95.2 kg   LMP 01/29/2016   SpO2 99%   BMI 34.91 kg/m   Physical Exam  Constitutional: She appears  well-developed and well-nourished. She appears distressed.  HENT:  Head: Normocephalic and atraumatic.  Mouth/Throat: Oropharynx is clear and moist. No oropharyngeal exudate.  Eyes: Conjunctivae and EOM are normal. Pupils are equal, round, and reactive to light. Right eye exhibits no discharge. Left eye exhibits no discharge. No scleral icterus.  Neck: Normal range of motion and phonation normal. Neck supple. No neck rigidity. Normal range of motion present.  Cardiovascular: Normal rate, regular rhythm, normal heart sounds and intact distal pulses.   No murmur heard. Pulmonary/Chest: Effort normal and breath sounds normal. No stridor. No respiratory distress. She has no wheezes. She has no rales.  Abdominal: Soft. She exhibits distension. Bowel sounds are decreased. There is tenderness. There is guarding. There is no rigidity, no rebound and no CVA tenderness.  Very tender in the LUQ and LLQ with guarding. Tender to palpation even with stethoscope. Abdomen is distended. Abdomen is soft.  Musculoskeletal: Normal range of motion.  Lymphadenopathy:    She has no cervical adenopathy.  Neurological: She is alert. She is not disoriented. Coordination and gait normal. GCS eye subscore is 4. GCS verbal subscore is 5. GCS motor subscore is 6.  Skin: Skin is warm and dry. She is not diaphoretic.  Psychiatric: She has a normal mood and affect. Her behavior is normal.  Nursing note and vitals reviewed.  ED Treatments / Results  DIAGNOSTIC STUDIES: Oxygen Saturation is 100% on RA, normal by my interpretation.   COORDINATION OF CARE: 10:26 PM-Discussed next steps with pt. Pt verbalized understanding and is agreeable with the plan.   Labs (all labs ordered are listed, but only abnormal results are displayed) Labs Reviewed  COMPREHENSIVE METABOLIC PANEL - Abnormal; Notable for the following:       Result Value   CO2 21 (*)    Glucose, Bld 149 (*)    Calcium 8.5 (*)    All other components within  normal limits  CBC - Abnormal; Notable for the following:    Hemoglobin 10.7 (*)    HCT 33.6 (*)    MCH 24.9 (*)    RDW 15.8 (*)    All other components within normal limits  URINALYSIS, ROUTINE W REFLEX MICROSCOPIC - Abnormal; Notable for the following:    Protein, ur 30 (*)    Squamous Epithelial / LPF 0-5 (*)    All other components within normal limits  LIPASE, BLOOD  I-STAT BETA HCG BLOOD, ED ([facility], [facility], [facility] ONLY)   EKG  EKG Interpretation None      Radiology No results found.  Procedures Procedures   Medications Ordered in ED Medications  ondansetron (ZOFRAN-ODT) disintegrating tablet 4 mg (4 mg Oral Given 02/21/16 2036)  ondansetron (ZOFRAN) injection 4 mg (4 mg Intravenous Given 02/21/16 2253)  sodium chloride 0.9 % bolus 1,000 mL (0 mLs Intravenous Stopped 02/22/16 0058)  morphine 2 MG/ML injection 2 mg (2 mg Intravenous Given 02/21/16 2254)  iopamidol (ISOVUE-300) 61 % injection 100 mL (100 mLs Intravenous Contrast Given 02/22/16 0014)   Initial Impression /  Assessment and Plan / ED Course  I have reviewed the triage vital signs and the nursing notes.  Pertinent labs & imaging results that were available during my care of the patient were reviewed by me and considered in my medical decision making (see chart for details).  Clinical Course as of Feb 24 2311  Thu Feb 21, 2016  2315 DG Abdomen Acute W/Chest [AM]  Fri Feb 22, 2016  0055 CT Abdomen Pelvis W Contrast [AM]  1610 On re-evaluation patient endorses resolution of nausea. States improvement in abdominal pain and distension. Abdomen is soft, mild TTP in LUQ and LLQ.   [AM]    Clinical Course User Index [AM] Lona Kettle, PA-C   Patient presents to ED with complaint of left sided abdominal pain and nausea onset today. Patient is afebrile and non-toxic appearing. She appears distressed and tearful secondary to pain. Vital signs are stable. Abdominal distension and decreased bowel sounds noted. TTP with  guarding in LUQ and LLQ. Will initiate IVF, anti-emetics, and pain medication. Will check basic labs and abdominal x-ray to assess for possible obstruction or perforation.   Pregnancy test negative - doubt ectopic of sxs secondary to pregnancy. Lipase nml - doubt pancreatitis. AST, ALT, and total bili nml, afebrile, no leukocytosis, no murphy's sign - doubt acute cholecystitis. No RLQ tenderness, no leukocytosis doubt acute appendicitis at this time. Stable anemia. Abdominal x-ray shows no evidence of obstruction or perforation. CT ordered for further evaluation - no acute abnormality, umbilical hernia present.  On re-evaluation pt endorses improvement in pain. On re-examination abdomen is mildly tender, but improved. Discussed results and plan with pt. Unclear cause of pain at this time. Encouraged close follow up with PCP. Rx pain medication and anti-emetic. Strict return precautions given. Pt voiced understanding and is agreeable.     Final Clinical Impressions(s) / ED Diagnoses   Final diagnoses:  Abdominal pain, unspecified abdominal location  Nausea   New Prescriptions Discharge Medication List as of 02/22/2016  2:14 AM    START taking these medications   Details  naproxen (NAPROSYN) 500 MG tablet Take 1 tablet (500 mg total) by mouth 2 (two) times daily., Starting Fri 02/22/2016, Print    ondansetron (ZOFRAN ODT) 4 MG disintegrating tablet Take 1 tablet (4 mg total) by mouth every 8 (eight) hours as needed for nausea or vomiting., Starting Fri 02/22/2016, Print       I personally performed the services described in this documentation, which was scribed in my presence. The recorded information has been reviewed and is accurate.     Lona Kettle, [place] 02/24/16 2312    Canary Brim Tegeler, MD 02/25/16 [phone]

## 2016-02-22 ENCOUNTER — Emergency Department (HOSPITAL_COMMUNITY): Payer: 59

## 2016-02-22 MED ORDER — IOPAMIDOL (ISOVUE-300) INJECTION 61%
100.0000 mL | Freq: Once | INTRAVENOUS | Status: AC | PRN
  Administered 2016-02-22: 100 mL via INTRAVENOUS

## 2016-02-22 MED ORDER — ONDANSETRON 4 MG PO TBDP: 4.0000 mg | ORAL_TABLET | Freq: Three times a day (TID) | ORAL | 0 refills | Status: DC | PRN

## 2016-02-22 MED ORDER — NAPROXEN 500 MG PO TABS: 500.0000 mg | ORAL_TABLET | Freq: Two times a day (BID) | ORAL | 0 refills | Status: DC

## 2016-02-22 NOTE — ED Notes (Signed)
Pt had one cup of soda and two crackers. Tolerated well

## 2016-02-22 NOTE — ED Notes (Signed)
Pt ambulatory and independent at discharge.  Verbalized understanding of discharge instructions 

## 2016-02-22 NOTE — ED Notes (Signed)
Patient given ginger ale and saltine crackers 

## 2016-02-22 NOTE — Discharge Instructions (Signed)
Read the information below.  Your labs and imaging are re-assuring.  I have prescribed pain medication and anti-nausea medication. While taking naprosyn do not take other NSAIDs (ibuprofen, aleve, or motrin). Please take as needed.  Be sure to stay well hydrated.  Please follow up with your primary doctor in 2-3 days for re-evaluation.  Use the prescribed medication as directed.  Please discuss all new medications with your pharmacist.   You may return to the Emergency Department at any time for worsening condition or any new symptoms that concern you. Return to ED if you develop fever, worsening abdominal pain, blood in urine, blood in stool, inability to keep food/fluids down, or any other new/concerning symptoms.

## 2016-03-31 ENCOUNTER — Other Ambulatory Visit: Payer: Self-pay | Admitting: Family Medicine

## 2016-03-31 DIAGNOSIS — N6320 Unspecified lump in the left breast, unspecified quadrant: Secondary | ICD-10-CM

## 2016-04-17 ENCOUNTER — Other Ambulatory Visit: Payer: Self-pay | Admitting: Family Medicine

## 2016-04-17 ENCOUNTER — Ambulatory Visit
Admission: RE | Admit: 2016-04-17 | Discharge: 2016-04-17 | Disposition: A | Discharge status: Home / Self Care | Payer: 59 | Source: Ambulatory Visit | Attending: Family Medicine | Admitting: Family Medicine

## 2016-04-17 DIAGNOSIS — N6320 Unspecified lump in the left breast, unspecified quadrant: Secondary | ICD-10-CM

## 2016-04-17 DIAGNOSIS — N63 Unspecified lump in unspecified breast: Secondary | ICD-10-CM

## 2016-04-17 HISTORY — DX: Unspecified lump in unspecified breast: N63.0

## 2016-05-23 DIAGNOSIS — E010 Iodine-deficiency related diffuse (endemic) goiter: Secondary | ICD-10-CM | POA: Insufficient documentation

## 2017-03-16 ENCOUNTER — Inpatient Hospital Stay (HOSPITAL_COMMUNITY): Payer: 59

## 2017-03-16 ENCOUNTER — Inpatient Hospital Stay (HOSPITAL_COMMUNITY)
Admission: AD | Admit: 2017-03-16 | Discharge: 2017-03-16 | Disposition: A | Discharge status: Home / Self Care | Payer: 59 | Source: Ambulatory Visit | Attending: Obstetrics and Gynecology | Admitting: Obstetrics and Gynecology

## 2017-03-16 ENCOUNTER — Encounter (HOSPITAL_COMMUNITY): Payer: Self-pay | Admitting: *Deleted

## 2017-03-16 DIAGNOSIS — K429 Umbilical hernia without obstruction or gangrene: Secondary | ICD-10-CM | POA: Insufficient documentation

## 2017-03-16 DIAGNOSIS — N6320 Unspecified lump in the left breast, unspecified quadrant: Secondary | ICD-10-CM | POA: Diagnosis not present

## 2017-03-16 DIAGNOSIS — R102 Pelvic and perineal pain unspecified side: Secondary | ICD-10-CM

## 2017-03-16 DIAGNOSIS — Z885 Allergy status to narcotic agent status: Secondary | ICD-10-CM | POA: Diagnosis not present

## 2017-03-16 DIAGNOSIS — Z888 Allergy status to other drugs, medicaments and biological substances status: Secondary | ICD-10-CM | POA: Diagnosis not present

## 2017-03-16 DIAGNOSIS — Z880 Allergy status to penicillin: Secondary | ICD-10-CM | POA: Diagnosis not present

## 2017-03-16 DIAGNOSIS — R52 Pain, unspecified: Secondary | ICD-10-CM | POA: Diagnosis present

## 2017-03-16 DIAGNOSIS — Z79899 Other long term (current) drug therapy: Secondary | ICD-10-CM | POA: Diagnosis not present

## 2017-03-16 DIAGNOSIS — Z9889 Other specified postprocedural states: Secondary | ICD-10-CM | POA: Diagnosis not present

## 2017-03-16 DIAGNOSIS — Z833 Family history of diabetes mellitus: Secondary | ICD-10-CM | POA: Diagnosis not present

## 2017-03-16 DIAGNOSIS — R109 Unspecified abdominal pain: Secondary | ICD-10-CM

## 2017-03-16 DIAGNOSIS — R110 Nausea: Secondary | ICD-10-CM | POA: Insufficient documentation

## 2017-03-16 LAB — CBC
HCT: 35.5 % — ABNORMAL LOW (ref 36.0–46.0)
Hemoglobin: 11.3 g/dL — ABNORMAL LOW (ref 12.0–15.0)
MCH: 24.9 pg — ABNORMAL LOW (ref 26.0–34.0)
MCHC: 31.8 g/dL (ref 30.0–36.0)
MCV: 78.2 fL (ref 78.0–100.0)
PLATELETS: 319 10*3/uL (ref 150–400)
RBC: 4.54 MIL/uL (ref 3.87–5.11)
RDW: 16.2 % — AB (ref 11.5–15.5)
WBC: 6.5 10*3/uL (ref 4.0–10.5)

## 2017-03-16 LAB — URINALYSIS, ROUTINE W REFLEX MICROSCOPIC

## 2017-03-16 LAB — WET PREP, GENITAL
CLUE CELLS WET PREP: NONE SEEN
Sperm: NONE SEEN
TRICH WET PREP: NONE SEEN
YEAST WET PREP: NONE SEEN

## 2017-03-16 LAB — POCT PREGNANCY, URINE: Preg Test, Ur: NEGATIVE

## 2017-03-16 LAB — URINALYSIS, MICROSCOPIC (REFLEX): BACTERIA UA: NONE SEEN

## 2017-03-16 MED ORDER — HYDROMORPHONE HCL 1 MG/ML IJ SOLN
1.0000 mg | Freq: Once | INTRAMUSCULAR | Status: AC
  Administered 2017-03-16: 1 mg via INTRAMUSCULAR
  Filled 2017-03-16: qty 1

## 2017-03-16 MED ORDER — PROMETHAZINE HCL 25 MG PO TABS: 25.0000 mg | ORAL_TABLET | Freq: Four times a day (QID) | ORAL | 0 refills | Status: DC | PRN

## 2017-03-16 MED ORDER — ONDANSETRON 4 MG PO TBDP
4.0000 mg | ORAL_TABLET | Freq: Once | ORAL | Status: AC
  Administered 2017-03-16: 4 mg via ORAL
  Filled 2017-03-16: qty 1

## 2017-03-16 MED ORDER — KETOROLAC TROMETHAMINE 60 MG/2ML IM SOLN
60.0000 mg | Freq: Once | INTRAMUSCULAR | Status: AC
  Administered 2017-03-16: 60 mg via INTRAMUSCULAR
  Filled 2017-03-16: qty 2

## 2017-03-16 MED ORDER — IOPAMIDOL (ISOVUE-300) INJECTION 61%
100.0000 mL | Freq: Once | INTRAVENOUS | Status: AC | PRN
  Administered 2017-03-16: 100 mL via INTRAVENOUS

## 2017-03-16 MED ORDER — IOPAMIDOL (ISOVUE-300) INJECTION 61%
30.0000 mL | INTRAVENOUS | Status: AC
  Administered 2017-03-16 (×2): 30 mL via ORAL

## 2017-03-16 MED ORDER — NAPROXEN 500 MG PO TABS: 500.0000 mg | ORAL_TABLET | Freq: Two times a day (BID) | ORAL | 0 refills | Status: DC | PRN

## 2017-03-16 NOTE — MAU Provider Note (Signed)
History     CSN: [id number]  Arrival date and time: 03/16/17 1447   First Provider Initiated Contact with Patient 03/16/17 1548      Chief Complaint  Patient presents with  . Abdominal Pain   HPI   Ms.Patricia Macdonald is a 32 y.o. female [phone] non pregnant, here in MAU with lower abdominal pain that started today at 1:00. States she started her menstrual cycle this morning as well. States she does not normally have painful periods. States she has not taken anything for the pain. The pain starts at the center of her abdomen and is worse on the right side. States the pain came on abruptly and has made her feel nauseated.  States her last period was 02/05/2017. States the her period is normal in flow. The pain is constant. Stats she has never had this pain before.   1 partner, her husband.   OB History    Gravida Para Term Preterm AB Living   6 4 4  0 2 4   SAB TAB Ectopic Multiple Live Births   2       1      Past Medical History:  Diagnosis Date  . Abnormal Pap smear of cervix   . Anemia   . Blood transfusion 2005  . Breast mass 04/17/2016   left breast lump x 400 x 3 weeks with pain  . Postpartum hemorrhage 09/2003    Past Surgical History:  Procedure Laterality Date  . COLPOSCOPY      Family History  Problem Relation Age of Onset  . Diabetes Mother     Social History   Tobacco Use  . Smoking status: Never Smoker  . Smokeless tobacco: Never Used  Substance Use Topics  . Alcohol use: No  . Drug use: No    Allergies:  Allergies  Allergen Reactions  . Penicillins Anaphylaxis    Has patient had a PCN reaction causing immediate rash, facial/tongue/throat swelling, SOB or lightheadedness with hypotension:  yes Has patient had a PCN reaction causing severe rash involving mucus membranes or skin necrosis:  no Has patient had a PCN reaction that required hospitalization: yes Has patient had a PCN reaction occurring within the last 10 years:no If all of the  above answers are "NO", then may proceed with Cephalosporin use.   . Latex Hives and Swelling  . Percocet [Oxycodone-Acetaminophen] Nausea Only    Patient can tolerate acetaminophen solely  . Reglan [Metoclopramide] Other (See Comments)    Akathisia     Medications Prior to Admission  Medication Sig Dispense Refill Last Dose  . iron polysaccharides (NIFEREX) 150 MG capsule Take 150 mg by mouth 2 (two) times daily.    03/15/2017 at Unknown time   Results for orders placed or performed during the hospital encounter of 03/16/17 (from the past 48 hour(s))  Urinalysis, Routine w reflex microscopic     Status: Abnormal   Collection Time: 03/16/17  3:00 PM  Result Value Ref Range   Color, Urine RED (A) YELLOW    Comment: BIOCHEMICALS MAY BE AFFECTED BY COLOR   APPearance TURBID (A) CLEAR   Specific Gravity, Urine  1.005 - 1.030    TEST NOT REPORTED DUE TO COLOR INTERFERENCE OF URINE PIGMENT   pH  5.0 - 8.0    TEST NOT REPORTED DUE TO COLOR INTERFERENCE OF URINE PIGMENT   Glucose, UA (A) NEGATIVE mg/dL    TEST NOT REPORTED DUE TO COLOR INTERFERENCE OF URINE PIGMENT  Hgb urine dipstick (A) NEGATIVE    TEST NOT REPORTED DUE TO COLOR INTERFERENCE OF URINE PIGMENT   Bilirubin Urine (A) NEGATIVE    TEST NOT REPORTED DUE TO COLOR INTERFERENCE OF URINE PIGMENT   Ketones, ur (A) NEGATIVE mg/dL    TEST NOT REPORTED DUE TO COLOR INTERFERENCE OF URINE PIGMENT   Protein, ur (A) NEGATIVE mg/dL    TEST NOT REPORTED DUE TO COLOR INTERFERENCE OF URINE PIGMENT   Nitrite (A) NEGATIVE    TEST NOT REPORTED DUE TO COLOR INTERFERENCE OF URINE PIGMENT   Leukocytes, UA (A) NEGATIVE    TEST NOT REPORTED DUE TO COLOR INTERFERENCE OF URINE PIGMENT  Urinalysis, Microscopic (reflex)     Status: Abnormal   Collection Time: 03/16/17  3:00 PM  Result Value Ref Range   RBC / HPF TOO NUMEROUS TO COUNT 0 - 5 RBC/hpf   WBC, UA 6-30 0 - 5 WBC/hpf   Bacteria, UA NONE SEEN NONE SEEN   Squamous Epithelial / LPF 0-5 (A)  NONE SEEN  Pregnancy, urine POC     Status: None   Collection Time: 03/16/17  3:09 PM  Result Value Ref Range   Preg Test, Ur NEGATIVE NEGATIVE    Comment:        THE SENSITIVITY OF THIS METHODOLOGY IS >24 mIU/mL   CBC     Status: Abnormal   Collection Time: 03/16/17  3:58 PM  Result Value Ref Range   WBC 6.5 4.0 - 10.5 K/uL   RBC 4.54 3.87 - 5.11 MIL/uL   Hemoglobin 11.3 (L) 12.0 - 15.0 g/dL   HCT 95.6 (L) 21.3 - 08.6 %   MCV 78.2 78.0 - 100.0 fL   MCH 24.9 (L) 26.0 - 34.0 pg   MCHC 31.8 30.0 - 36.0 g/dL   RDW 57.8 (H) 46.9 - 62.9 %   Platelets 319 150 - 400 K/uL  Wet prep, genital     Status: Abnormal   Collection Time: 03/16/17  7:00 PM  Result Value Ref Range   Yeast Wet Prep HPF POC NONE SEEN NONE SEEN   Trich, Wet Prep NONE SEEN NONE SEEN   Clue Cells Wet Prep HPF POC NONE SEEN NONE SEEN   WBC, Wet Prep HPF POC MODERATE (A) NONE SEEN    Comment: MANY BACTERIA SEEN   Sperm NONE SEEN     Review of Systems  Constitutional: Negative for fever.  Gastrointestinal: Positive for abdominal pain and nausea. Negative for vomiting.   Physical Exam   Blood pressure 124/60, pulse 78, temperature 97.9 F (36.6 C), temperature source Oral, resp. rate 18, height 5\' 6"  (1.676 m), weight 98.4 kg (217 lb), last menstrual period 02/05/2017, SpO2 100 %, unknown if currently breastfeeding.  Physical Exam  Constitutional: She is oriented to person, place, and time. She appears well-developed and well-nourished.  Non-toxic appearance. She does not have a sickly appearance. She does not appear ill. No distress.  HENT:  Head: Normocephalic.  Eyes: Pupils are equal, round, and reactive to light.  Respiratory: Effort normal.  GI: Soft. Normal appearance and bowel sounds are normal. There is tenderness in the right lower quadrant, periumbilical area and suprapubic area. There is rigidity. There is no rebound and no guarding.    Genitourinary:  Genitourinary Comments: Bimanual exam: Cervix  closed, anterior  Uterus with enderness, slightly enlarged  Right Adnexal tenderness, no masses bilaterally GC/Chlam, wet prep done Chaperone present for exam.   Musculoskeletal: Normal range of motion.  Neurological: She  is alert and oriented to person, place, and time.  Skin: Skin is warm. She is not diaphoretic.  Psychiatric: Her behavior is normal.   MAU Course  Procedures  None  MDM  Patient appears distressed and tearful secondary to pain. Vital signs are stable. Abdominal rigidity, no rebound in RMQ and RLQ.   Toradol 60 mg IM given; no relief  CBC, UA, UPT Patient called out using call light crying, in the fetal position in the bed asking for additional pain medication while holding her abdomen.  Dilaudid 1 mg IM given Pelvic US ordered. Will attempt to re perform pelvic exam after pain medication given. Patient without fever, no nausea/ vomiting, pain is down from 10/10 to 4/10; clinic picture indicates a possible ruptured ovarian cyst with US showing small free fluid in the right adnexa.  Patient actively vomiting in MAU; Will CT patient at this time.  Report given to Donette Larry who resumes care of the patient who is awaiting CT scan.   2110: Assumed care from Rash, FNP  Results for orders placed or performed during the hospital encounter of 03/16/17 (from the past 24 hour(s))  Urinalysis, Routine w reflex microscopic     Status: Abnormal   Collection Time: 03/16/17  3:00 PM  Result Value Ref Range   Color, Urine RED (A) YELLOW   APPearance TURBID (A) CLEAR   Specific Gravity, Urine  1.005 - 1.030    TEST NOT REPORTED DUE TO COLOR INTERFERENCE OF URINE PIGMENT   pH  5.0 - 8.0    TEST NOT REPORTED DUE TO COLOR INTERFERENCE OF URINE PIGMENT   Glucose, UA (A) NEGATIVE mg/dL    TEST NOT REPORTED DUE TO COLOR INTERFERENCE OF URINE PIGMENT   Hgb urine dipstick (A) NEGATIVE    TEST NOT REPORTED DUE TO COLOR INTERFERENCE OF URINE PIGMENT   Bilirubin Urine (A)  NEGATIVE    TEST NOT REPORTED DUE TO COLOR INTERFERENCE OF URINE PIGMENT   Ketones, ur (A) NEGATIVE mg/dL    TEST NOT REPORTED DUE TO COLOR INTERFERENCE OF URINE PIGMENT   Protein, ur (A) NEGATIVE mg/dL    TEST NOT REPORTED DUE TO COLOR INTERFERENCE OF URINE PIGMENT   Nitrite (A) NEGATIVE    TEST NOT REPORTED DUE TO COLOR INTERFERENCE OF URINE PIGMENT   Leukocytes, UA (A) NEGATIVE    TEST NOT REPORTED DUE TO COLOR INTERFERENCE OF URINE PIGMENT  Urinalysis, Microscopic (reflex)     Status: Abnormal   Collection Time: 03/16/17  3:00 PM  Result Value Ref Range   RBC / HPF TOO NUMEROUS TO COUNT 0 - 5 RBC/hpf   WBC, UA 6-30 0 - 5 WBC/hpf   Bacteria, UA NONE SEEN NONE SEEN   Squamous Epithelial / LPF 0-5 (A) NONE SEEN  Pregnancy, urine POC     Status: None   Collection Time: 03/16/17  3:09 PM  Result Value Ref Range   Preg Test, Ur NEGATIVE NEGATIVE  CBC     Status: Abnormal   Collection Time: 03/16/17  3:58 PM  Result Value Ref Range   WBC 6.5 4.0 - 10.5 K/uL   RBC 4.54 3.87 - 5.11 MIL/uL   Hemoglobin 11.3 (L) 12.0 - 15.0 g/dL   HCT 69.6 (L) 29.5 - 28.4 %   MCV 78.2 78.0 - 100.0 fL   MCH 24.9 (L) 26.0 - 34.0 pg   MCHC 31.8 30.0 - 36.0 g/dL   RDW 13.2 (H) 44.0 - 10.2 %   Platelets 319  150 - 400 K/uL  Wet prep, genital     Status: Abnormal   Collection Time: 03/16/17  7:00 PM  Result Value Ref Range   Yeast Wet Prep HPF POC NONE SEEN NONE SEEN   Trich, Wet Prep NONE SEEN NONE SEEN   Clue Cells Wet Prep HPF POC NONE SEEN NONE SEEN   WBC, Wet Prep HPF POC MODERATE (A) NONE SEEN   Sperm NONE SEEN    Ct Abdomen Pelvis W Contrast  Result Date: 03/16/2017 CLINICAL DATA:  Lower abdominal pain, nausea and vomiting, onset today. EXAM: CT ABDOMEN AND PELVIS WITH CONTRAST TECHNIQUE: Multidetector CT imaging of the abdomen and pelvis was performed using the standard protocol following bolus administration of intravenous contrast. CONTRAST:  ISOVUE-300 IOPAMIDOL (ISOVUE-300) INJECTION  61% COMPARISON:  02/22/2016 CT abdomen/pelvis. FINDINGS: Lower chest: No significant pulmonary nodules or acute consolidative airspace disease. Hepatobiliary: Normal liver size. No liver mass. Normal gallbladder with no radiopaque cholelithiasis. No biliary ductal dilatation. Pancreas: Normal, with no mass or duct dilation. Spleen: Normal size. No mass. Adrenals/Urinary Tract: Normal adrenals. Normal kidneys with no hydronephrosis and no renal mass. Normal bladder. Stomach/Bowel: Normal non-distended stomach. Normal caliber small bowel with no small bowel wall thickening. Normal appendix. Normal large bowel with no diverticulosis, large bowel wall thickening or pericolonic fat stranding. Vascular/Lymphatic: Normal caliber abdominal aorta. Patent portal, splenic, hepatic and renal veins. No pathologically enlarged lymph nodes in the abdomen or pelvis. Reproductive: Grossly normal uterus.  No adnexal mass. Other: No pneumoperitoneum, ascites or focal fluid collection. Small fat containing umbilical hernia. Musculoskeletal: No aggressive appearing focal osseous lesions. IMPRESSION: No acute abnormality. No evidence of bowel obstruction or acute bowel inflammation. Normal appendix. Small fat containing umbilical hernia. Electronically Signed   By: Delbert Phenix M.D.   On: 03/16/2017 22:54   US Pelvic Complete W Transvaginal And Torsion R/o  Result Date: 03/16/2017 CLINICAL DATA:  Severe right lower quadrant pain for 1 hour EXAM: TRANSABDOMINAL AND TRANSVAGINAL ULTRASOUND OF PELVIS TECHNIQUE: Both transabdominal and transvaginal ultrasound examinations of the pelvis were performed. Transabdominal technique was performed for global imaging of the pelvis including uterus, ovaries, adnexal regions, and pelvic cul-de-sac. It was necessary to proceed with endovaginal exam following the transabdominal exam to visualize the uterus, endometrium and ovaries. COMPARISON:  None FINDINGS: Uterus Measurements: 10.3 x 5.1 x 5.3 cm.  No fibroids or other mass visualized. Endometrium Thickness: 10.2 mm.  Small calcification noted. Right ovary Measurements: 3.1 x 1.7 x 2.4 cm. Normal appearance/no adnexal mass. Left ovary Measurements: 2.3 x 2.0 x 1.8 cm. Normal appearance/no adnexal mass. Other findings Small volume of free fluid noted within the right adnexa. IMPRESSION: 1. Unremarkable appearance of the uterus and adnexal structures. 2. Small volume of free fluid noted within the right adnexa. Electronically Signed   By: Signa Kell M.D.   On: 03/16/2017 18:43   Labs and imaging reviewed. No evidence of acute abdominal or pelvic process. Free fluid may indicate remnants of ruptured ovarian cyst. Pain improved after meds. Stable for discharge home.   Assessment and Plan   1. Acute pelvic pain, female   2. Abdominal pain   3. Acute pain   4. Sudden onset of severe abdominal pain    Discharge home Follow up with GYN as needed Rx Naproxen Rx Phenergan  Allergies as of 03/16/2017      Reactions   Penicillins Anaphylaxis   Has patient had a PCN reaction causing immediate rash, facial/tongue/throat swelling, SOB or lightheadedness  with hypotension:  yes Has patient had a PCN reaction causing severe rash involving mucus membranes or skin necrosis:  no Has patient had a PCN reaction that required hospitalization: yes Has patient had a PCN reaction occurring within the last 10 years:no If all of the above answers are "NO", then may proceed with Cephalosporin use.   Latex Hives, Swelling   Percocet [oxycodone-acetaminophen] Nausea Only   Patient can tolerate acetaminophen solely   Reglan [metoclopramide] Other (See Comments)   Akathisia       Medication List    TAKE these medications   iron polysaccharides 150 MG capsule Commonly known as:  NIFEREX Take 150 mg by mouth 2 (two) times daily.   naproxen 500 MG tablet Commonly known as:  NAPROSYN Take 1 tablet (500 mg total) by mouth 2 (two) times daily as needed.     promethazine 25 MG tablet Commonly known as:  PHENERGAN Take 1 tablet (25 mg total) by mouth every 6 (six) hours as needed for nausea or vomiting.      Donette LarryBhambri, Dynver Clemson, CNM  03/16/2017 11:17 PM

## 2017-03-16 NOTE — MAU Note (Signed)
Pt C/O RLQ pain for the last hour, severe onset, not quite as bad now, was very sharp.  Starting bleeding today, thinks it is her period but this pain is different from menstrual cramps.

## 2017-03-16 NOTE — MAU Note (Signed)
Pt has been nauseated and med's were given for nausea so pt could finish drink, pt is now finished with her 2 cups of contrast 1 more will be given @2120 .

## 2017-03-16 NOTE — Discharge Instructions (Signed)
Pelvic Pain, Female Pelvic pain is pain in your lower belly (abdomen), below your belly button and between your hips. The pain may start suddenly (acute), keep coming back (recurring), or last a long time (chronic). Pelvic pain that lasts longer than six months is considered chronic. There are many causes of pelvic pain. Sometimes the cause of your pelvic pain is not known. Follow these instructions at home:  Take over-the-counter and prescription medicines only as told by your doctor.  Rest as told by your doctor.  Do not have sex it if hurts.  Keep a journal of your pelvic pain. Write down: ? When the pain started. ? Where the pain is located. ? What seems to make the pain better or worse, such as food or your menstrual cycle. ? Any symptoms you have along with the pain.  Keep all follow-up visits as told by your doctor. This is important. Contact a doctor if:  Medicine does not help your pain.  Your pain comes back.  You have new symptoms.  You have unusual vaginal discharge or bleeding.  You have a fever or chills.  You are having a hard time pooping (constipation).  You have blood in your pee (urine) or poop (stool).  Your pee smells bad.  You feel weak or lightheaded. Get help right away if:  You have sudden pain that is very bad.  Your pain continues to get worse.  You have very bad pain and also have any of the following symptoms: ? A fever. ? Feeling stick to your stomach (nausea). ? Throwing up (vomiting). ? Being very sweaty.  You pass out (lose consciousness). This information is not intended to replace advice given to you by your health care provider. Make sure you discuss any questions you have with your health care provider. Document Released: 08/06/2007 Document Revised: 03/14/2015 Document Reviewed: 12/08/2014 Elsevier Interactive Patient Education  2018 [employer] Ovarian Cyst An ovarian cyst is a fluid-filled sac on an ovary. The ovaries are  organs that make eggs in women. Most ovarian cysts go away on their own and are not cancerous (are benign). Some cysts need treatment. Follow these instructions at home:  Take over-the-counter and prescription medicines only as told by your doctor.  Do not drive or use heavy machinery while taking prescription pain medicine.  Get pelvic exams and Pap tests as often as told by your doctor.  Return to your normal activities as told by your doctor. Ask your doctor what activities are safe for you.  Do not use any products that contain nicotine or tobacco, such as cigarettes and e-cigarettes. If you need help quitting, ask your doctor.  Keep all follow-up visits as told by your doctor. This is important. Contact a doctor if:  Your periods are: ? Late. ? Irregular. ? Painful.  Your periods stop.  You have pelvic pain that does not go away.  You have pressure on your bladder.  You have trouble making your bladder empty when you pee (urinate).  You have pain during sex.  You have any of the following in your belly (abdomen): ? A feeling of fullness. ? Pressure. ? Discomfort. ? Pain that does not go away. ? Swelling.  You feel sick most of the time.  You have trouble pooping (have constipation).  You are not as hungry as usual (you lose your appetite).  You get very bad acne.  You start to have more hair on your body and face.  You are gaining  weight or losing weight without changing your exercise and eating habits.  You think you may be pregnant. Get help right away if:  You have belly pain that is very bad or gets worse.  You cannot eat or drink without throwing up (vomiting).  You suddenly get a fever.  Your period is a lot heavier than usual. This information is not intended to replace advice given to you by your health care provider. Make sure you discuss any questions you have with your health care provider. Document Released: 08/06/2007 Document Revised:  09/07/2015 Document Reviewed: 07/22/2015 Elsevier Interactive Patient Education  [employer].

## 2017-03-18 LAB — GC/CHLAMYDIA PROBE AMP (~~LOC~~) NOT AT ARMC
CHLAMYDIA, DNA PROBE: NEGATIVE
NEISSERIA GONORRHEA: NEGATIVE

## 2017-04-20 ENCOUNTER — Emergency Department (HOSPITAL_COMMUNITY)
Admission: EM | Admit: 2017-04-20 | Discharge: 2017-04-20 | Disposition: A | Discharge status: Home / Self Care | Payer: 59 | Attending: Emergency Medicine | Admitting: Emergency Medicine

## 2017-04-20 ENCOUNTER — Other Ambulatory Visit: Payer: Self-pay

## 2017-04-20 ENCOUNTER — Emergency Department (HOSPITAL_COMMUNITY): Payer: 59

## 2017-04-20 ENCOUNTER — Encounter (HOSPITAL_COMMUNITY): Payer: Self-pay

## 2017-04-20 DIAGNOSIS — S29019A Strain of muscle and tendon of unspecified wall of thorax, initial encounter: Secondary | ICD-10-CM | POA: Diagnosis not present

## 2017-04-20 DIAGNOSIS — Z79899 Other long term (current) drug therapy: Secondary | ICD-10-CM | POA: Diagnosis not present

## 2017-04-20 DIAGNOSIS — R0602 Shortness of breath: Secondary | ICD-10-CM | POA: Diagnosis present

## 2017-04-20 DIAGNOSIS — X58XXXA Exposure to other specified factors, initial encounter: Secondary | ICD-10-CM | POA: Diagnosis not present

## 2017-04-20 DIAGNOSIS — Y999 Unspecified external cause status: Secondary | ICD-10-CM | POA: Insufficient documentation

## 2017-04-20 DIAGNOSIS — Z91040 Latex allergy status: Secondary | ICD-10-CM | POA: Insufficient documentation

## 2017-04-20 DIAGNOSIS — Y939 Activity, unspecified: Secondary | ICD-10-CM | POA: Diagnosis not present

## 2017-04-20 DIAGNOSIS — Y929 Unspecified place or not applicable: Secondary | ICD-10-CM | POA: Diagnosis not present

## 2017-04-20 LAB — CBC
HCT: 36.5 % (ref 36.0–46.0)
Hemoglobin: 11.1 g/dL — ABNORMAL LOW (ref 12.0–15.0)
MCH: 24.5 pg — AB (ref 26.0–34.0)
MCHC: 30.4 g/dL (ref 30.0–36.0)
MCV: 80.6 fL (ref 78.0–100.0)
PLATELETS: 341 10*3/uL (ref 150–400)
RBC: 4.53 MIL/uL (ref 3.87–5.11)
RDW: 16.6 % — AB (ref 11.5–15.5)
WBC: 5.5 10*3/uL (ref 4.0–10.5)

## 2017-04-20 LAB — URINALYSIS, ROUTINE W REFLEX MICROSCOPIC
BILIRUBIN URINE: NEGATIVE
Hgb urine dipstick: NEGATIVE
KETONES UR: NEGATIVE mg/dL
LEUKOCYTES UA: NEGATIVE
NITRITE: NEGATIVE
PROTEIN: NEGATIVE mg/dL
Specific Gravity, Urine: 1.025 (ref 1.005–1.030)
pH: 7 (ref 5.0–8.0)

## 2017-04-20 LAB — I-STAT TROPONIN, ED: Troponin i, poc: 0 ng/mL (ref 0.00–0.08)

## 2017-04-20 LAB — BASIC METABOLIC PANEL
Anion gap: 10 (ref 5–15)
BUN: 12 mg/dL (ref 6–20)
CHLORIDE: 108 mmol/L (ref 101–111)
CO2: 22 mmol/L (ref 22–32)
CREATININE: 0.82 mg/dL (ref 0.44–1.00)
Calcium: 9.4 mg/dL (ref 8.9–10.3)
GFR calc Af Amer: >60 mL/min (ref >60)
GFR calc non Af Amer: >60 mL/min (ref >60)
GLUCOSE: 96 mg/dL (ref 65–99)
Potassium: 3.9 mmol/L (ref 3.5–5.1)
SODIUM: 140 mmol/L (ref 135–145)

## 2017-04-20 LAB — I-STAT BETA HCG BLOOD, ED (MC, WL, AP ONLY)

## 2017-04-20 LAB — D-DIMER, QUANTITATIVE: D-Dimer, Quant: 0.37 ug/mL-FEU (ref 0.00–0.50)

## 2017-04-20 MED ORDER — IBUPROFEN 400 MG PO TABS
400.0000 mg | ORAL_TABLET | Freq: Once | ORAL | Status: AC
  Administered 2017-04-20: 400 mg via ORAL
  Filled 2017-04-20: qty 1

## 2017-04-20 NOTE — ED Provider Notes (Deleted)
Brief screening exam. Patient c/o thoracic/back/side pain for past couple days. Worse w deep breath, pleuritic. Mild sob. Denies cough or uri c/o. No fever. No chest pain. Denies abd pain. No leg pain or swelling. No hx dvt or pe. No strain/trauma to area. Chest cta bil. Rrr. abd soft nt. Vitals:   04/20/17 1328  BP: 126/78  Pulse: 91  Resp: 18  Temp: 98.7 F (37.1 C)  SpO2: 100%   Labs and cxr ordered.    Patricia LaineSteinl, Patricia Mcgeehan, MD 04/20/17 1345

## 2017-04-20 NOTE — ED Triage Notes (Signed)
Pt presents to the ed with complaints of shortness of breath x 3 days and flank pain that is worse with inspiration. NAD in triage.

## 2017-04-20 NOTE — Discharge Instructions (Addendum)
It was our pleasure to provide your ER care today - we hope that you feel better.  Take motrin or aleve as need for pain.  Your xrays and lab test results are good/normal.  Follow up with primary care doctor in 1 week if symptoms fail to improve/resolve.  Return to ER if worse, new symptoms, increased trouble breathing, other concern.

## 2017-04-20 NOTE — ED Provider Notes (Signed)
[facility] EMERGENCY DEPARTMENT Provider Note   CSN: [id number] Arrival date & time: 04/20/17  1216     History   Chief Complaint Chief Complaint  Patient presents with  . Shortness of Breath    HPI Patricia Macdonald is a 32 y.o. female.  Patient c/o pain to upper/mid back area and posterior chest region in past couple days. Symptoms mild-moderatee, persistent. Worse w deep breath, mildly pleuritic. ?mild sob. Rare non prod cough. No sore throat, congestion or other uri c/o. No anterior pain or anterior chest pain. No abd pain. Denies fever or chills. No leg pain or swelling. No hx dvt or pe. Denies back injury or strain.    The history is provided by the patient.  Shortness of Breath  Pertinent negatives include no fever, no headaches, no sore throat, no neck pain, no chest pain, no vomiting, no abdominal pain and no rash.    Past Medical History:  Diagnosis Date  . Abnormal Pap smear of cervix   . Anemia   . Blood transfusion 2005  . Breast mass 04/17/2016   left breast lump x 400 x 3 weeks with pain  . Postpartum hemorrhage 09/2003    Patient Active Problem List   Diagnosis Date Noted  . Folliculitis 11/20/2010    Past Surgical History:  Procedure Laterality Date  . COLPOSCOPY      OB History    Gravida Para Term Preterm AB Living   6 4 4  0 2 4   SAB TAB Ectopic Multiple Live Births   2       1       Home Medications    Prior to Admission medications   Medication Sig Start Date End Date Taking? Authorizing Provider  Cholecalciferol (VITAMIN D PO) Take 1 tablet by mouth daily.   Yes [provider]  iron polysaccharides (NIFEREX) 150 MG capsule Take 150 mg by mouth 2 (two) times daily.  07/02/15 04/20/17 Yes [provider]  naproxen (NAPROSYN) 500 MG tablet Take 1 tablet (500 mg total) by mouth 2 (two) times daily as needed. Patient not taking: Reported on 04/20/2017 03/16/17 03/16/18  Donette Larry, CNM  promethazine  (PHENERGAN) 25 MG tablet Take 1 tablet (25 mg total) by mouth every 6 (six) hours as needed for nausea or vomiting. Patient not taking: Reported on 04/20/2017 03/16/17   Donette Larry, CNM    Family History Family History  Problem Relation Age of Onset  . Diabetes Mother     Social History Social History   Tobacco Use  . Smoking status: Never Smoker  . Smokeless tobacco: Never Used  Substance Use Topics  . Alcohol use: No  . Drug use: No     Allergies   Penicillins; Latex; Percocet [oxycodone-acetaminophen]; and Reglan [metoclopramide]   Review of Systems Review of Systems  Constitutional: Negative for chills and fever.  HENT: Negative for sore throat.   Eyes: Negative for redness.  Respiratory: Positive for shortness of breath.   Cardiovascular: Negative for chest pain.  Gastrointestinal: Negative for abdominal pain and vomiting.  Genitourinary: Negative for dysuria and flank pain.  Musculoskeletal: Negative for neck pain.  Skin: Negative for rash.  Neurological: Negative for headaches.  Hematological: Does not bruise/bleed easily.  Psychiatric/Behavioral: Negative for confusion.     Physical Exam Updated Vital Signs BP 126/78 (BP Location: Right Arm)   Pulse 91   Temp 98.7 F (37.1 C) (Oral)   Resp 18   LMP 04/14/2017  SpO2 100%   Physical Exam  Constitutional: She appears well-developed and well-nourished. No distress.  HENT:  Mouth/Throat: Oropharynx is clear and moist.  Eyes: Conjunctivae are normal. No scleral icterus.  Neck: Neck supple. No tracheal deviation present.  Cardiovascular: Normal rate, regular rhythm, normal heart sounds and intact distal pulses. Exam reveals no gallop and no friction rub.  No murmur heard. Pulmonary/Chest: Effort normal and breath sounds normal. No respiratory distress.  Abdominal: Soft. Normal appearance and bowel sounds are normal. She exhibits no distension. There is no tenderness.  Genitourinary:  Genitourinary  Comments: No cva tenderness  Musculoskeletal: She exhibits no edema or tenderness.  T/L spine non tender, aligned, no step off.   Neurological: She is alert.  Skin: Skin is warm and dry. No rash noted. She is not diaphoretic.  Psychiatric: She has a normal mood and affect.  Nursing note and vitals reviewed.    ED Treatments / Results  Labs (all labs ordered are listed, but only abnormal results are displayed) Results for orders placed or performed during the hospital encounter of 04/20/17  Basic metabolic panel  Result Value Ref Range   Sodium 140 135 - 145 mmol/L   Potassium 3.9 3.5 - 5.1 mmol/L   Chloride 108 101 - 111 mmol/L   CO2 22 22 - 32 mmol/L   Glucose, Bld 96 65 - 99 mg/dL   BUN 12 6 - 20 mg/dL   Creatinine, Ser 1.47 0.44 - 1.00 mg/dL   Calcium 9.4 8.9 - 82.9 mg/dL   GFR calc non Af Amer >60 >60 mL/min   GFR calc Af Amer >60 >60 mL/min   Anion gap 10 5 - 15  CBC  Result Value Ref Range   WBC 5.5 4.0 - 10.5 K/uL   RBC 4.53 3.87 - 5.11 MIL/uL   Hemoglobin 11.1 (L) 12.0 - 15.0 g/dL   HCT 56.2 13.0 - 86.5 %   MCV 80.6 78.0 - 100.0 fL   MCH 24.5 (L) 26.0 - 34.0 pg   MCHC 30.4 30.0 - 36.0 g/dL   RDW 78.4 (H) 69.6 - 29.5 %   Platelets 341 150 - 400 K/uL  D-dimer, quantitative (not at [facility])  Result Value Ref Range   D-Dimer, Quant 0.37 0.00 - 0.50 ug/mL-FEU  I-stat troponin, ED  Result Value Ref Range   Troponin i, poc 0.00 0.00 - 0.08 ng/mL   Comment 3          I-Stat beta hCG blood, ED  Result Value Ref Range   I-stat hCG, quantitative <5.0 <5 mIU/mL   Comment 3           Dg Chest 2 View  Result Date: 04/20/2017 CLINICAL DATA:  Low back pain and shortness of breath over the last 3 days. EXAM: CHEST  2 VIEW COMPARISON:  02/21/2016 FINDINGS: Heart size is normal. Mediastinal shadows are normal. The lungs are clear. No bronchial thickening. No infiltrate, mass, effusion or collapse. Pulmonary vascularity is normal. No bony abnormality. IMPRESSION: Normal chest  Electronically Signed   By: Paulina Fusi M.D.   On: 04/20/2017 15:10    EKG  EKG Interpretation  Date/Time:  Monday April 20 2017 13:28:18 EST Ventricular Rate:  90 PR Interval:  166 QRS Duration: 78 QT Interval:  370 QTC Calculation: 452 R Axis:   91 Text Interpretation:  Normal sinus rhythm Rightward axis Nonspecific T wave abnormality No significant change since last tracing Confirmed by Cathren Laine ([id number]) on 04/20/2017 2:24:16 PM  Radiology Dg Chest 2 View  Result Date: 04/20/2017 CLINICAL DATA:  Low back pain and shortness of breath over the last 3 days. EXAM: CHEST  2 VIEW COMPARISON:  02/21/2016 FINDINGS: Heart size is normal. Mediastinal shadows are normal. The lungs are clear. No bronchial thickening. No infiltrate, mass, effusion or collapse. Pulmonary vascularity is normal. No bony abnormality. IMPRESSION: Normal chest Electronically Signed   By: Paulina FusiMark  Shogry M.D.   On: 04/20/2017 15:10    Procedures Procedures (including critical care time)  Medications Ordered in ED Medications - No data to display   Initial Impression / Assessment and Plan / ED Course  I have reviewed the triage vital signs and the nursing notes.  Pertinent labs & imaging results that were available during my care of the patient were reviewed by me and considered in my medical decision making (see chart for details).  Reviewed nursing notes and prior charts for additional history.   Chest xray reviewed - no pneumonia or acute process.  Labs reviewed - ddimer is normal. Symptoms/exam/results do not appear c/w   Motrin po.  Patient appears stable for d/c.     Final Clinical Impressions(s) / ED Diagnoses   Final diagnoses:  None    ED Discharge Orders    None       Cathren LaineSteinl, Nathian Stencil, MD 04/20/17 1523

## 2017-08-24 ENCOUNTER — Emergency Department (HOSPITAL_BASED_OUTPATIENT_CLINIC_OR_DEPARTMENT_OTHER)
Admission: EM | Admit: 2017-08-24 | Discharge: 2017-08-24 | Disposition: A | Discharge status: Home / Self Care | Payer: 59 | Attending: Emergency Medicine | Admitting: Emergency Medicine

## 2017-08-24 ENCOUNTER — Other Ambulatory Visit: Payer: Self-pay

## 2017-08-24 ENCOUNTER — Encounter (HOSPITAL_BASED_OUTPATIENT_CLINIC_OR_DEPARTMENT_OTHER): Payer: Self-pay

## 2017-08-24 DIAGNOSIS — Z79899 Other long term (current) drug therapy: Secondary | ICD-10-CM | POA: Insufficient documentation

## 2017-08-24 DIAGNOSIS — Z91040 Latex allergy status: Secondary | ICD-10-CM | POA: Insufficient documentation

## 2017-08-24 DIAGNOSIS — R519 Headache, unspecified: Secondary | ICD-10-CM

## 2017-08-24 DIAGNOSIS — R51 Headache: Secondary | ICD-10-CM | POA: Insufficient documentation

## 2017-08-24 DIAGNOSIS — R600 Localized edema: Secondary | ICD-10-CM | POA: Insufficient documentation

## 2017-08-24 DIAGNOSIS — R079 Chest pain, unspecified: Secondary | ICD-10-CM | POA: Insufficient documentation

## 2017-08-24 DIAGNOSIS — M7989 Other specified soft tissue disorders: Secondary | ICD-10-CM

## 2017-08-24 LAB — BASIC METABOLIC PANEL
ANION GAP: 7 (ref 5–15)
BUN: 11 mg/dL (ref 6–20)
CHLORIDE: 103 mmol/L (ref 101–111)
CO2: 26 mmol/L (ref 22–32)
Calcium: 8.8 mg/dL — ABNORMAL LOW (ref 8.9–10.3)
Creatinine, Ser: 0.82 mg/dL (ref 0.44–1.00)
GFR calc Af Amer: >60 mL/min (ref >60)
GFR calc non Af Amer: >60 mL/min (ref >60)
Glucose, Bld: 115 mg/dL — ABNORMAL HIGH (ref 65–99)
POTASSIUM: 3.9 mmol/L (ref 3.5–5.1)
SODIUM: 136 mmol/L (ref 135–145)

## 2017-08-24 LAB — CBC WITH DIFFERENTIAL/PLATELET
Basophils Absolute: 0 10*3/uL (ref 0.0–0.1)
Basophils Relative: 0 %
Eosinophils Absolute: 0.2 10*3/uL (ref 0.0–0.7)
Eosinophils Relative: 2 %
HEMATOCRIT: 34.8 % — AB (ref 36.0–46.0)
HEMOGLOBIN: 11.3 g/dL — AB (ref 12.0–15.0)
LYMPHS ABS: 2.1 10*3/uL (ref 0.7–4.0)
LYMPHS PCT: 30 %
MCH: 25.1 pg — AB (ref 26.0–34.0)
MCHC: 32.5 g/dL (ref 30.0–36.0)
MCV: 77.2 fL — AB (ref 78.0–100.0)
Monocytes Absolute: 0.5 10*3/uL (ref 0.1–1.0)
Monocytes Relative: 6 %
NEUTROS ABS: 4.4 10*3/uL (ref 1.7–7.7)
NEUTROS PCT: 62 %
Platelets: 321 10*3/uL (ref 150–400)
RBC: 4.51 MIL/uL (ref 3.87–5.11)
RDW: 16.9 % — ABNORMAL HIGH (ref 11.5–15.5)
WBC: 7.1 10*3/uL (ref 4.0–10.5)

## 2017-08-24 LAB — PREGNANCY, URINE: Preg Test, Ur: NEGATIVE

## 2017-08-24 LAB — D-DIMER, QUANTITATIVE: D-Dimer, Quant: 0.35 ug/mL-FEU (ref 0.00–0.50)

## 2017-08-24 NOTE — Discharge Instructions (Signed)
Please schedule an appointment with your doctor for follow-up.  If your symptoms return or you have additional concerns he may always return to the emergency room for repeat evaluation.

## 2017-08-24 NOTE — ED Triage Notes (Signed)
C/o bilat leg swelling x 1 hour-HA x 30 min-c/o CP x 10 min-NAD-steady gait

## 2017-08-24 NOTE — ED Notes (Signed)
Pt refused discharge VS.  

## 2017-08-24 NOTE — ED Notes (Signed)
ED Provider at bedside. 

## 2017-08-24 NOTE — ED Provider Notes (Signed)
[facility] EMERGENCY DEPARTMENT Provider Note   CSN: [id number] Arrival date & time: 08/24/17  1902     History   Chief Complaint Chief Complaint  Patient presents with  . Leg Swelling    HPI Patricia Macdonald is a 32 y.o. female who presents today for evaluation of multiple complaints. 1.  Bilateral leg swelling.  She reports that last night she noted slight swelling in her left leg, and then today she went to work where she is sedentary, got off work and went to change when she noticed that her bilateral legs were swollen.  She reports that they feel tight, mostly around the ankles.  She does not have a history of leg swelling in the past.  She denies any recent traumas or injury.  She denies numbness or tingling.  2.  Chest pain She reports that after she noticed her legs were swelling she got anxious and had onset of middle of chest pain.  This does not radiate or move, is not associated with diaphoresis, nausea, vomiting, or shortness of breath.  She reports that it hurts more when she breathes.  No cardiac history.  3.  Headache Patient reports that at the same time that she developed chest pain she developed a frontal headache.  She does not normally get headaches, and did not try any interventions prior to arrival.  She denies any numbness, tingling, weakness, gait changes or balance changes.  No change in vision.  She reports that she feels that this is most likely related to anxiety from the leg swelling.  HPI  Past Medical History:  Diagnosis Date  . Abnormal Pap smear of cervix   . Anemia   . Blood transfusion 2005  . Breast mass 04/17/2016   left breast lump x 400 x 3 weeks with pain  . Postpartum hemorrhage 09/2003    Patient Active Problem List   Diagnosis Date Noted  . Folliculitis 11/20/2010    Past Surgical History:  Procedure Laterality Date  . COLPOSCOPY       OB History    Gravida  6   Para  4   Term  4   Preterm  0   AB  2   Living  4     SAB  2   TAB      Ectopic      Multiple      Live Births  1            Home Medications    Prior to Admission medications   Medication Sig Start Date End Date Taking? Authorizing Provider  Cholecalciferol (VITAMIN D PO) Take 1 tablet by mouth daily.    [provider]  iron polysaccharides (NIFEREX) 150 MG capsule Take 150 mg by mouth 2 (two) times daily.  07/02/15 04/20/17  [provider]  naproxen (NAPROSYN) 500 MG tablet Take 1 tablet (500 mg total) by mouth 2 (two) times daily as needed. Patient not taking: Reported on 04/20/2017 03/16/17 03/16/18  Donette LarryBhambri, Melanie, CNM  promethazine (PHENERGAN) 25 MG tablet Take 1 tablet (25 mg total) by mouth every 6 (six) hours as needed for nausea or vomiting. Patient not taking: Reported on 04/20/2017 03/16/17   Donette LarryBhambri, Melanie, CNM    Family History Family History  Problem Relation Age of Onset  . Diabetes Mother     Social History Social History   Tobacco Use  . Smoking status: Never Smoker  . Smokeless tobacco: Never Used  Substance  Use Topics  . Alcohol use: No  . Drug use: No     Allergies   Penicillins; Latex; Percocet [oxycodone-acetaminophen]; and Reglan [metoclopramide]   Review of Systems Review of Systems  Constitutional: Negative for chills and fever.  Respiratory: Negative for cough, chest tightness and shortness of breath.   Cardiovascular: Positive for chest pain and leg swelling.  Neurological: Positive for headaches. Negative for weakness and numbness.  Psychiatric/Behavioral: The patient is nervous/anxious.   All other systems reviewed and are negative.    Physical Exam Updated Vital Signs BP 129/81 (BP Location: Right Arm)   Pulse 76   Temp 99.1 F (37.3 C) (Oral)   Resp 16   Ht 5\' 6"  (1.676 m)   Wt 99.8 kg (220 lb)   LMP 07/11/2017   SpO2 100%   BMI 35.51 kg/m   Physical Exam  Constitutional: She is oriented to person, place, and time. She appears  well-developed and well-nourished. No distress.  HENT:  Head: Normocephalic and atraumatic.  Eyes: Conjunctivae are normal. Right eye exhibits no discharge. Left eye exhibits no discharge. No scleral icterus.  Neck: Normal range of motion.  Cardiovascular: Normal rate, regular rhythm, normal heart sounds and intact distal pulses.  Bilateral lower extremities appear symmetrical.  No pitting edema.  Generalized TTP.   Pulmonary/Chest: Effort normal and breath sounds normal. No stridor. No respiratory distress.  Abdominal: Soft. Bowel sounds are normal. She exhibits no distension. There is no tenderness. There is no guarding.  Musculoskeletal: She exhibits no edema or deformity.  Neurological: She is alert and oriented to person, place, and time. No cranial nerve deficit. She exhibits normal muscle tone. Coordination normal.  Skin: Skin is warm and dry. She is not diaphoretic.  Psychiatric: She has a normal mood and affect. Her behavior is normal.  Nursing note and vitals reviewed.    ED Treatments / Results  Labs (all labs ordered are listed, but only abnormal results are displayed) Labs Reviewed  BASIC METABOLIC PANEL - Abnormal; Notable for the following components:      Result Value   Glucose, Bld 115 (*)    Calcium 8.8 (*)    All other components within normal limits  CBC WITH DIFFERENTIAL/PLATELET - Abnormal; Notable for the following components:   Hemoglobin 11.3 (*)    HCT 34.8 (*)    MCV 77.2 (*)    MCH 25.1 (*)    RDW 16.9 (*)    All other components within normal limits  PREGNANCY, URINE  D-DIMER, QUANTITATIVE (NOT AT [facility])    EKG EKG Interpretation  Date/Time:  Monday August 24 2017 19:19:11 EDT Ventricular Rate:  91 PR Interval:  176 QRS Duration: 78 QT Interval:  350 QTC Calculation: 430 R Axis:   51 Text Interpretation:  Normal sinus rhythm Possible Left atrial enlargement Borderline ECG no sig change from previous Confirmed by Arby Barrette [phone]) on  08/24/2017 11:56:58 PM   Radiology No results found.  Procedures Procedures (including critical care time)  Medications Ordered in ED Medications - No data to display   Initial Impression / Assessment and Plan / ED Course  I have reviewed the triage vital signs and the nursing notes.  Pertinent labs & imaging results that were available during my care of the patient were reviewed by me and considered in my medical decision making (see chart for details).  Clinical Course as of Aug 25 2355  Mon Aug 24, 2017  2305 Patient re-evaluated, is feeling better, requesting to  go home at this time.     [EH]    Clinical Course User Index [EH] Cristina Gong, PA-C   Patient presents today for evaluation of multiple complaints.  Her primary complaint is swelling in her bilateral legs that is been present for a few hours.  Edema is not pitting in nature, she does not have other signs of fluid overload such as shortness of breath.  Based on concern of leg swelling with chest pain d-dimer was obtained which was negative.  Her legs were elevated while in the emergency room which resulted in improvement of her symptoms.    Patient had headache that improved while she was in the emergency room, patient states that she feels like her headache is anxiety related as she looked online to find causes of her leg swelling which scared her.  Patient also complained of chest pain which started at this time.  Discussed with her work-up and evaluation, she declined chest x-ray, troponin testing.  EKG obtained and reviewed without significant acute abnormalities.  She feels like her chest pain is anxiety related, as it improved in the department without specific treatment.  Return precautions were discussed with patient who states her understanding.  After labs resulted she requested discharge.  Patient discharged home with PCP follow-up.   Final Clinical Impressions(s) / ED Diagnoses   Final diagnoses:    Leg swelling  Chest pain, unspecified type  Acute nonintractable headache, unspecified headache type    ED Discharge Orders    None       Norman Clay 08/24/17 2357    Arby Barrette, MD 09/03/17 2240

## 2017-10-21 ENCOUNTER — Encounter (HOSPITAL_COMMUNITY): Payer: Self-pay | Admitting: *Deleted

## 2017-10-21 ENCOUNTER — Inpatient Hospital Stay (HOSPITAL_COMMUNITY): Payer: 59

## 2017-10-21 ENCOUNTER — Inpatient Hospital Stay (HOSPITAL_COMMUNITY)
Admission: AD | Admit: 2017-10-21 | Discharge: 2017-10-21 | Disposition: A | Discharge status: Home / Self Care | Payer: 59 | Source: Ambulatory Visit | Attending: Obstetrics and Gynecology | Admitting: Obstetrics and Gynecology

## 2017-10-21 DIAGNOSIS — Z3491 Encounter for supervision of normal pregnancy, unspecified, first trimester: Secondary | ICD-10-CM

## 2017-10-21 DIAGNOSIS — Z880 Allergy status to penicillin: Secondary | ICD-10-CM | POA: Insufficient documentation

## 2017-10-21 DIAGNOSIS — O99711 Diseases of the skin and subcutaneous tissue complicating pregnancy, first trimester: Secondary | ICD-10-CM | POA: Insufficient documentation

## 2017-10-21 DIAGNOSIS — Z3A10 10 weeks gestation of pregnancy: Secondary | ICD-10-CM | POA: Diagnosis not present

## 2017-10-21 DIAGNOSIS — O26899 Other specified pregnancy related conditions, unspecified trimester: Secondary | ICD-10-CM

## 2017-10-21 DIAGNOSIS — L732 Hidradenitis suppurativa: Secondary | ICD-10-CM

## 2017-10-21 DIAGNOSIS — R109 Unspecified abdominal pain: Secondary | ICD-10-CM | POA: Diagnosis not present

## 2017-10-21 DIAGNOSIS — L02413 Cutaneous abscess of right upper limb: Secondary | ICD-10-CM | POA: Diagnosis present

## 2017-10-21 LAB — WET PREP, GENITAL
Clue Cells Wet Prep HPF POC: NONE SEEN
Sperm: NONE SEEN
Trich, Wet Prep: NONE SEEN
Yeast Wet Prep HPF POC: NONE SEEN

## 2017-10-21 LAB — CBC WITH DIFFERENTIAL/PLATELET
Basophils Absolute: 0 10*3/uL (ref 0.0–0.1)
Basophils Relative: 0 %
Eosinophils Absolute: 0.2 10*3/uL (ref 0.0–0.7)
Eosinophils Relative: 2 %
HEMATOCRIT: 33.7 % — AB (ref 36.0–46.0)
HEMOGLOBIN: 10.6 g/dL — AB (ref 12.0–15.0)
LYMPHS ABS: 2.2 10*3/uL (ref 0.7–4.0)
Lymphocytes Relative: 29 %
MCH: 24.2 pg — AB (ref 26.0–34.0)
MCHC: 31.5 g/dL (ref 30.0–36.0)
MCV: 76.9 fL — AB (ref 78.0–100.0)
Monocytes Absolute: 0.4 10*3/uL (ref 0.1–1.0)
Monocytes Relative: 6 %
NEUTROS ABS: 4.6 10*3/uL (ref 1.7–7.7)
NEUTROS PCT: 63 %
Platelets: 282 10*3/uL (ref 150–400)
RBC: 4.38 MIL/uL (ref 3.87–5.11)
RDW: 16.2 % — ABNORMAL HIGH (ref 11.5–15.5)
WBC: 7.3 10*3/uL (ref 4.0–10.5)

## 2017-10-21 LAB — HCG, QUANTITATIVE, PREGNANCY: hCG, Beta Chain, Quant, S: 35084 m[IU]/mL — ABNORMAL HIGH (ref <5)

## 2017-10-21 LAB — ABO/RH: ABO/RH(D): O POS

## 2017-10-21 LAB — POCT PREGNANCY, URINE: PREG TEST UR: POSITIVE — AB

## 2017-10-21 NOTE — MAU Note (Signed)
Urine in lab 

## 2017-10-21 NOTE — MAU Provider Note (Signed)
History     CSN: [id number]  Arrival date and time: 10/21/17 1807   First Provider Initiated Contact with Patient 10/21/17 1930      Chief Complaint  Patient presents with  . Wound Infection   HPI Patricia Macdonald is 32 y.o. [id number] 7059w6d weeks presenting with complaint of abscess under her arms. Hx of hidradenitis. Told by her PCP today that the culture was + for 3 germs and the Clindamycin that they gave her last week would not completely treat.  Patient states they told her to go to ED for treatment or arrange with them to have IV Vancomycin.  She reports taking Clindamycin for only 3 days because she began with a rash and a burning sensation.  In the meantime, she had + pregnancy test yesterday.  She began with pain that she describes as intermittent,sharp across the "bottom" of her abdomen.  Neg for abnormal vaginal discharge or vaginal bleeding.  Rates pain as 6/10 at this time and 7/10 at its worse.   Past Medical History:  Diagnosis Date  . Abnormal Pap smear of cervix   . Anemia   . Blood transfusion 2005  . Breast mass 04/17/2016   left breast lump x 400 x 3 weeks with pain  . Postpartum hemorrhage 09/2003    Past Surgical History:  Procedure Laterality Date  . COLPOSCOPY      Family History  Problem Relation Age of Onset  . Diabetes Mother     Social History   Tobacco Use  . Smoking status: Never Smoker  . Smokeless tobacco: Never Used  Substance Use Topics  . Alcohol use: No  . Drug use: No    Allergies:  Allergies  Allergen Reactions  . Penicillins Anaphylaxis    Has patient had a PCN reaction causing immediate rash, facial/tongue/throat swelling, SOB or lightheadedness with hypotension:  yes Has patient had a PCN reaction causing severe rash involving mucus membranes or skin necrosis:  no Has patient had a PCN reaction that required hospitalization: yes Has patient had a PCN reaction occurring within the last 10 years:no If all of the above answers  are "NO", then may proceed with Cephalosporin use.   . Latex Hives and Swelling  . Percocet [Oxycodone-Acetaminophen] Nausea Only    Patient can tolerate acetaminophen solely  . Reglan [Metoclopramide] Other (See Comments)    Akathisia   . Clindamycin/Lincomycin Other (See Comments)    Burning     Medications Prior to Admission  Medication Sig Dispense Refill Last Dose  . Cholecalciferol (VITAMIN D PO) Take 1 tablet by mouth daily.   04/19/2017 at Unknown time  . iron polysaccharides (NIFEREX) 150 MG capsule Take 150 mg by mouth 2 (two) times daily.    04/19/2017 at Unknown time  . naproxen (NAPROSYN) 500 MG tablet Take 1 tablet (500 mg total) by mouth 2 (two) times daily as needed. (Patient not taking: Reported on 04/20/2017) 20 tablet 0 Not Taking at Unknown time  . promethazine (PHENERGAN) 25 MG tablet Take 1 tablet (25 mg total) by mouth every 6 (six) hours as needed for nausea or vomiting. (Patient not taking: Reported on 04/20/2017) 30 tablet 0 Not Taking at Unknown time    Review of Systems  Constitutional: Negative for activity change, appetite change and fever.  Respiratory: Negative for shortness of breath.   Cardiovascular: Negative for chest pain.  Gastrointestinal: Positive for abdominal pain (lower abdominal intermittent sharp pain). Negative for nausea and vomiting.  Genitourinary: Negative for  dysuria, flank pain, vaginal bleeding and vaginal discharge.  Skin:       Abscesses in right axilla without drainage.   Chronic area in left axilla without drainage.  Psychiatric/Behavioral: Negative for behavioral problems.   Physical Exam   Blood pressure 131/82, pulse 90, temperature 98.7 F (37.1 C), temperature source Oral, resp. rate 16, height 5\' 6"  (1.676 m), weight 102.5 kg, last menstrual period 08/06/2017, SpO2 99 %, unknown if currently breastfeeding.  Physical Exam  Nursing note and vitals reviewed. Constitutional: She is oriented to person, place, and time. She  appears well-developed and well-nourished. No distress.  HENT:  Head: Normocephalic.  Neck: Normal range of motion.  Cardiovascular: Normal rate.  Respiratory: Effort normal.  GI: Soft. She exhibits no distension and no mass. There is tenderness (diffuse lower abdominal tenderness on exam). There is no rebound and no guarding.  Genitourinary: There is no rash, tenderness or lesion on the right labia. There is no rash, tenderness or lesion on the left labia. Uterus is enlarged (slightly enlarge and soft.  Size < expected size by LMP). Uterus is not tender. Cervix exhibits no motion tenderness, no discharge and no friability. Right adnexum displays no mass, no tenderness and no fullness. Left adnexum displays no mass, no tenderness and no fullness. No erythema, tenderness or bleeding in the vagina. No vaginal discharge found.  Neurological: She is alert and oriented to person, place, and time.  Skin: Skin is warm and dry.    Right axilla-- the area of concern appears to be healing Neg for redness.  Left axilla-small area that is red without purulent drainage.  Psychiatric: She has a normal mood and affect. Her behavior is normal. Thought content normal.   Results for orders placed or performed during the hospital encounter of 10/21/17 (from the past 24 hour(s))  Pregnancy, urine POC     Status: Abnormal   Collection Time: 10/21/17  7:08 PM  Result Value Ref Range   Preg Test, Ur POSITIVE (A) NEGATIVE  hCG, quantitative, pregnancy     Status: Abnormal   Collection Time: 10/21/17  7:29 PM  Result Value Ref Range   hCG, Beta Chain, Quant, S 35,084 (H) <5 mIU/mL  ABO/Rh     Status: None   Collection Time: 10/21/17  7:29 PM  Result Value Ref Range   ABO/RH(D)      O POS Performed at [facility], [address], [place], [place] [address]   CBC with Differential/Platelet     Status: Abnormal   Collection Time: 10/21/17  7:29 PM  Result Value Ref Range   WBC 7.3 4.0 - 10.5 K/uL   RBC  4.38 3.87 - 5.11 MIL/uL   Hemoglobin 10.6 (L) 12.0 - 15.0 g/dL   HCT 62.1 (L) 30.8 - 65.7 %   MCV 76.9 (L) 78.0 - 100.0 fL   MCH 24.2 (L) 26.0 - 34.0 pg   MCHC 31.5 30.0 - 36.0 g/dL   RDW 84.6 (H) 96.2 - 95.2 %   Platelets 282 150 - 400 K/uL   Neutrophils Relative % 63 %   Neutro Abs 4.6 1.7 - 7.7 K/uL   Lymphocytes Relative 29 %   Lymphs Abs 2.2 0.7 - 4.0 K/uL   Monocytes Relative 6 %   Monocytes Absolute 0.4 0.1 - 1.0 K/uL   Eosinophils Relative 2 %   Eosinophils Absolute 0.2 0.0 - 0.7 K/uL   Basophils Relative 0 %   Basophils Absolute 0.0 0.0 - 0.1 K/uL  Wet  prep, genital     Status: Abnormal   Collection Time: 10/21/17  7:30 PM  Result Value Ref Range   Yeast Wet Prep HPF POC NONE SEEN NONE SEEN   Trich, Wet Prep NONE SEEN NONE SEEN   Clue Cells Wet Prep HPF POC NONE SEEN NONE SEEN   WBC, Wet Prep HPF POC MANY (A) NONE SEEN   Sperm NONE SEEN     [place] Ob Comp Less 14 Wks  Result Date: 10/21/2017 CLINICAL DATA:  Abdominal cramping EXAM: OBSTETRIC <14 WK US AND TRANSVAGINAL OB US TECHNIQUE: Both transabdominal and transvaginal ultrasound examinations were performed for complete evaluation of the gestation as well as the maternal uterus, adnexal regions, and pelvic cul-de-sac. Transvaginal technique was performed to assess early pregnancy. COMPARISON:  None. FINDINGS: Intrauterine gestational sac: Visualized Yolk sac:  Visualized Embryo:  Visualized Cardiac Activity: Visualized Heart Rate: 109 bpm CRL:  3 mm   5 w   5 d                  US EDC: June 18, 2018 Subchorionic hemorrhage:  None visualized. Maternal uterus/adnexae: Cervical os is closed. Right ovary measures 2.8 x 2.1 x 2.2 cm. Left ovary measures 3.2 x 2.8 x 3.2 cm. No extrauterine pelvic mass. No free pelvic fluid. IMPRESSION: Single live intrauterine gestation with estimated gestational age is 6-weeks. No subchorionic hemorrhage. Study otherwise unremarkable. Electronically Signed   By: Bretta BangWilliam  Woodruff III M.D.   On:  10/21/2017 20:27   [place] Ob Transvaginal  Result Date: 10/21/2017 CLINICAL DATA:  Abdominal cramping EXAM: OBSTETRIC <14 WK US AND TRANSVAGINAL OB US TECHNIQUE: Both transabdominal and transvaginal ultrasound examinations were performed for complete evaluation of the gestation as well as the maternal uterus, adnexal regions, and pelvic cul-de-sac. Transvaginal technique was performed to assess early pregnancy. COMPARISON:  None. FINDINGS: Intrauterine gestational sac: Visualized Yolk sac:  Visualized Embryo:  Visualized Cardiac Activity: Visualized Heart Rate: 109 bpm CRL:  3 mm   5 w   5 d                  US EDC: June 18, 2018 Subchorionic hemorrhage:  None visualized. Maternal uterus/adnexae: Cervical os is closed. Right ovary measures 2.8 x 2.1 x 2.2 cm. Left ovary measures 3.2 x 2.8 x 3.2 cm. No extrauterine pelvic mass. No free pelvic fluid. IMPRESSION: Single live intrauterine gestation with estimated gestational age is 6-weeks. No subchorionic hemorrhage. Study otherwise unremarkable. Electronically Signed   By: Bretta BangWilliam  Woodruff III M.D.   On: 10/21/2017 20:27   MAU Course  Procedures  GC/CHL and HIV pending   MDM MSE Exam Labs U/S  Assessment and Plan  A:  Abdominal pain in first trimester      Viable IUP at 2562w0d      hx of Chronic Hidradenitis-current lesions are healing       P: List of providers given to patient to begin prenatal care.       May use Tylenol for cramping.       Keep axilla clean and dry        Dennison Mascotve M Demisha Nokes 10/21/2017, 8:44 PM

## 2017-10-21 NOTE — MAU Note (Signed)
Pt was seen at Urgent Care last week for a boil under her arm. States they gave her an antibiotic but called and said the culture came back and she needed iv antibiotics. Pt also reports positive home preg test last week and occasional lower abd cramping.

## 2017-10-21 NOTE — Discharge Instructions (Signed)
Hidradenitis Suppurativa Hidradenitis suppurativa is a long-term (chronic) skin disease that starts with blocked sweat glands or hair follicles. Bacteria may grow in these blocked openings of your skin. Hidradenitis suppurativa is like a severe form of acne that develops in areas of your body where acne would be unusual. It is most likely to affect the areas of your body where skin rubs against skin and becomes moist. This includes your:  Underarms.  Groin.  Genital areas.  Buttocks.  Upper thighs.  Breasts.  Hidradenitis suppurativa may start out with small pimples. The pimples can develop into deep sores that break open (rupture) and drain pus. Over time your skin may thicken and become scarred. Hidradenitis suppurativa cannot be passed from person to person. What are the causes? The exact cause of hidradenitis suppurativa is not known. This condition may be due to:  Female and female hormones. The condition is rare before and after puberty.  An overactive body defense system (immune system). Your immune system may overreact to the blocked hair follicles or sweat glands and cause swelling and pus-filled sores.  What increases the risk? You may have a higher risk of hidradenitis suppurativa if you:  Are a woman.  Are between ages 7111 and 7155.  Have a family history of hidradenitis suppurativa.  Have a personal history of acne.  Are overweight.  Smoke.  Take the drug lithium.  What are the signs or symptoms? The first signs of an outbreak are usually painful skin bumps that look like pimples. As the condition progresses:  Skin bumps may get bigger and grow deeper into the skin.  Bumps under the skin may rupture and drain smelly pus.  Skin may become itchy and infected.  Skin may thicken and scar.  Drainage may continue through tunnels under the skin (fistulas).  Walking and moving your arms can become painful.  How is this diagnosed? Your health care provider may  diagnose hidradenitis suppurativa based on your medical history and your signs and symptoms. A physical exam will also be done. You may need to see a health care provider who specializes in skin diseases (dermatologist). You may also have tests done to confirm the diagnosis. These can include:  Swabbing a sample of pus or drainage from your skin so it can be sent to the lab and tested for infection.  Blood tests to check for infection.  How is this treated? The same treatment will not work for everybody with hidradenitis suppurativa. Your treatment will depend on how severe your symptoms are. You may need to try several treatments to find what works best for you. Part of your treatment may include cleaning and bandaging (dressing) your wounds. You may also have to take medicines, such as the following:  Antibiotics.  Acne medicines.  Medicines to block or suppress the immune system.  A diabetes medicine (metformin) is sometimes used to treat this condition.  For women, birth control pills can sometimes help relieve symptoms.  You may need surgery if you have a severe case of hidradenitis suppurativa that does not respond to medicine. Surgery may involve:  Using a laser to clear the skin and remove hair follicles.  Opening and draining deep sores.  Removing the areas of skin that are diseased and scarred.  Follow these instructions at home:  Learn as much as you can about your disease, and work closely with your health care providers.  Take medicines only as directed by your health care provider.  If you were prescribed  an antibiotic medicine, finish it all even if you start to feel better.  If you are overweight, losing weight may be very helpful. Try to reach and maintain a healthy weight.  Do not use any tobacco products, including cigarettes, chewing tobacco, or electronic cigarettes. If you need help quitting, ask your health care provider.  Do not shave the areas where you  get hidradenitis suppurativa.  Do not wear deodorant.  Wear loose-fitting clothes.  Try not to overheat and get sweaty.  Take a daily bleach bath as directed by your health care provider. ? Fill your bathtub halfway with water. ? Pour in  cup of unscented household bleach. ? Soak for 5-10 minutes.  Cover sore areas with a warm, clean washcloth (compress) for 5-10 minutes. Contact a health care provider if:  You have a flare-up of hidradenitis suppurativa.  You have chills or a fever.  You are having trouble controlling your symptoms at home. This information is not intended to replace advice given to you by your health care provider. Make sure you discuss any questions you have with your health care provider. Document Released: 10/02/2003 Document Revised: 07/26/2015 Document Reviewed: 05/20/2013 Elsevier Interactive Patient Education  2018 [employer] Abdominal Pain During Pregnancy Abdominal pain is common in pregnancy. Most of the time, it does not cause harm. There are many causes of abdominal pain. Some causes are more serious than others and sometimes the cause is not known. Abdominal pain can be a sign that something is very wrong with the pregnancy or the pain may have nothing to do with the pregnancy. Always tell your health care provider if you have any abdominal pain. Follow these instructions at home:  Do not have sex or put anything in your vagina until your symptoms go away completely.  Watch your abdominal pain for any changes.  Get plenty of rest until your pain improves.  Drink enough fluid to keep your urine clear or pale yellow.  Take over-the-counter or prescription medicines only as told by your health care provider.  Keep all follow-up visits as told by your health care provider. This is important. Contact a health care provider if:  You have a fever.  Your pain gets worse or you have cramping.  Your pain continues after resting. Get help right  away if:  You are bleeding, leaking fluid, or passing tissue from the vagina.  You have vomiting or diarrhea that does not go away.  You have painful or bloody urination.  You notice a decrease in your baby's movements.  You feel very weak or faint.  You have shortness of breath.  You develop a severe headache with abdominal pain.  You have abnormal vaginal discharge with abdominal pain. This information is not intended to replace advice given to you by your health care provider. Make sure you discuss any questions you have with your health care provider. Document Released: 02/17/2005 Document Revised: 11/29/2015 Document Reviewed: 09/16/2012 Elsevier Interactive Patient Education  2018 [employer]  START PRENATAL VITAMINS

## 2017-10-22 LAB — GC/CHLAMYDIA PROBE AMP (~~LOC~~) NOT AT ARMC
Chlamydia: NEGATIVE
NEISSERIA GONORRHEA: NEGATIVE

## 2017-10-22 LAB — HIV ANTIBODY (ROUTINE TESTING W REFLEX): HIV SCREEN 4TH GENERATION: NONREACTIVE

## 2017-12-04 ENCOUNTER — Emergency Department (HOSPITAL_BASED_OUTPATIENT_CLINIC_OR_DEPARTMENT_OTHER): Payer: 59

## 2017-12-04 ENCOUNTER — Emergency Department (HOSPITAL_BASED_OUTPATIENT_CLINIC_OR_DEPARTMENT_OTHER)
Admission: EM | Admit: 2017-12-04 | Discharge: 2017-12-04 | Disposition: A | Discharge status: Home / Self Care | Payer: 59 | Attending: Emergency Medicine | Admitting: Emergency Medicine

## 2017-12-04 ENCOUNTER — Encounter (HOSPITAL_BASED_OUTPATIENT_CLINIC_OR_DEPARTMENT_OTHER): Payer: Self-pay | Admitting: Emergency Medicine

## 2017-12-04 ENCOUNTER — Other Ambulatory Visit: Payer: Self-pay

## 2017-12-04 DIAGNOSIS — O26891 Other specified pregnancy related conditions, first trimester: Secondary | ICD-10-CM | POA: Diagnosis not present

## 2017-12-04 DIAGNOSIS — B9689 Other specified bacterial agents as the cause of diseases classified elsewhere: Secondary | ICD-10-CM | POA: Diagnosis not present

## 2017-12-04 DIAGNOSIS — N760 Acute vaginitis: Secondary | ICD-10-CM | POA: Diagnosis not present

## 2017-12-04 DIAGNOSIS — B373 Candidiasis of vulva and vagina: Secondary | ICD-10-CM | POA: Insufficient documentation

## 2017-12-04 DIAGNOSIS — O219 Vomiting of pregnancy, unspecified: Secondary | ICD-10-CM | POA: Diagnosis not present

## 2017-12-04 DIAGNOSIS — Z91040 Latex allergy status: Secondary | ICD-10-CM | POA: Insufficient documentation

## 2017-12-04 DIAGNOSIS — R1030 Lower abdominal pain, unspecified: Secondary | ICD-10-CM

## 2017-12-04 DIAGNOSIS — Z3A11 11 weeks gestation of pregnancy: Secondary | ICD-10-CM | POA: Insufficient documentation

## 2017-12-04 DIAGNOSIS — N390 Urinary tract infection, site not specified: Secondary | ICD-10-CM

## 2017-12-04 DIAGNOSIS — R109 Unspecified abdominal pain: Secondary | ICD-10-CM | POA: Diagnosis not present

## 2017-12-04 DIAGNOSIS — O9989 Other specified diseases and conditions complicating pregnancy, childbirth and the puerperium: Secondary | ICD-10-CM | POA: Insufficient documentation

## 2017-12-04 DIAGNOSIS — B3731 Acute candidiasis of vulva and vagina: Secondary | ICD-10-CM

## 2017-12-04 LAB — URINALYSIS, ROUTINE W REFLEX MICROSCOPIC
Bilirubin Urine: NEGATIVE
Glucose, UA: 250 mg/dL — AB
Hgb urine dipstick: NEGATIVE
Ketones, ur: NEGATIVE mg/dL
Nitrite: NEGATIVE
PH: 6.5 (ref 5.0–8.0)
PROTEIN: NEGATIVE mg/dL
Specific Gravity, Urine: 1.025 (ref 1.005–1.030)

## 2017-12-04 LAB — COMPREHENSIVE METABOLIC PANEL
ALT: 14 U/L (ref 0–44)
AST: 16 U/L (ref 15–41)
Albumin: 3.2 g/dL — ABNORMAL LOW (ref 3.5–5.0)
Alkaline Phosphatase: 73 U/L (ref 38–126)
Anion gap: 9 (ref 5–15)
BUN: 7 mg/dL (ref 6–20)
CALCIUM: 8.9 mg/dL (ref 8.9–10.3)
CHLORIDE: 104 mmol/L (ref 98–111)
CO2: 21 mmol/L — AB (ref 22–32)
CREATININE: 0.59 mg/dL (ref 0.44–1.00)
Glucose, Bld: 94 mg/dL (ref 70–99)
Potassium: 3.8 mmol/L (ref 3.5–5.1)
SODIUM: 134 mmol/L — AB (ref 135–145)
Total Bilirubin: 0.4 mg/dL (ref 0.3–1.2)
Total Protein: 7.6 g/dL (ref 6.5–8.1)

## 2017-12-04 LAB — CBC WITH DIFFERENTIAL/PLATELET
Basophils Absolute: 0 10*3/uL (ref 0.0–0.1)
Basophils Relative: 0 %
EOS ABS: 0.2 10*3/uL (ref 0.0–0.7)
EOS PCT: 3 %
HCT: 34.5 % — ABNORMAL LOW (ref 36.0–46.0)
Hemoglobin: 11.1 g/dL — ABNORMAL LOW (ref 12.0–15.0)
LYMPHS ABS: 1.5 10*3/uL (ref 0.7–4.0)
Lymphocytes Relative: 21 %
MCH: 24.2 pg — ABNORMAL LOW (ref 26.0–34.0)
MCHC: 32.2 g/dL (ref 30.0–36.0)
MCV: 75.3 fL — ABNORMAL LOW (ref 78.0–100.0)
MONO ABS: 0.6 10*3/uL (ref 0.1–1.0)
Monocytes Relative: 8 %
Neutro Abs: 5 10*3/uL (ref 1.7–7.7)
Neutrophils Relative %: 68 %
PLATELETS: 270 10*3/uL (ref 150–400)
RBC: 4.58 MIL/uL (ref 3.87–5.11)
RDW: 17.4 % — AB (ref 11.5–15.5)
WBC: 7.3 10*3/uL (ref 4.0–10.5)

## 2017-12-04 LAB — URINALYSIS, MICROSCOPIC (REFLEX)

## 2017-12-04 LAB — WET PREP, GENITAL
SPERM: NONE SEEN
Trich, Wet Prep: NONE SEEN

## 2017-12-04 MED ORDER — MECLIZINE HCL 25 MG PO TABS: 25.0000 mg | ORAL_TABLET | Freq: Three times a day (TID) | ORAL | 0 refills | Status: DC | PRN

## 2017-12-04 MED ORDER — SODIUM CHLORIDE 0.9 % IV BOLUS
1000.0000 mL | Freq: Once | INTRAVENOUS | Status: AC
  Administered 2017-12-04: 1000 mL via INTRAVENOUS

## 2017-12-04 MED ORDER — CEPHALEXIN 500 MG PO CAPS: 500.0000 mg | ORAL_CAPSULE | Freq: Two times a day (BID) | ORAL | 0 refills | Status: AC

## 2017-12-04 MED ORDER — METRONIDAZOLE 500 MG PO TABS: 500.0000 mg | ORAL_TABLET | Freq: Two times a day (BID) | ORAL | 0 refills | Status: AC

## 2017-12-04 MED ORDER — CLOTRIMAZOLE 1 % VA CREA: 1.0000 | TOPICAL_CREAM | Freq: Every day | VAGINAL | 0 refills | Status: AC

## 2017-12-04 MED ORDER — ONDANSETRON HCL 4 MG/2ML IJ SOLN
4.0000 mg | Freq: Once | INTRAMUSCULAR | Status: AC
  Administered 2017-12-04: 4 mg via INTRAVENOUS
  Filled 2017-12-04: qty 2

## 2017-12-04 NOTE — ED Provider Notes (Signed)
[facility] EMERGENCY DEPARTMENT Provider Note   CSN: [id number] Arrival date & time: 12/04/17  0813     History   Chief Complaint Chief Complaint  Patient presents with  . Abdominal Pain    HPI Patricia Macdonald is a 32 y.o. female.  HPI   [redacted]wk pregnant, presents with abdominal and back pain Woke up this AM with it Cramping pain Back with dull ache Nausea, vomiting, can't keep anything down.  Sick for last 3 days.  Taking b6 and unisom, still having nausea and vomiting.  Sometimes doesn't have anything on stomach, having dry heaves. No fevers No vaginal bleeding Yesterday had vaginal itching, not sure if yeast infection Dysuria, thinks because itching.   Past Medical History:  Diagnosis Date  . Abnormal Pap smear of cervix   . Anemia   . Blood transfusion 2005  . Breast mass 04/17/2016   left breast lump x 400 x 3 weeks with pain  . Postpartum hemorrhage 09/2003    Patient Active Problem List   Diagnosis Date Noted  . Folliculitis 11/20/2010    Past Surgical History:  Procedure Laterality Date  . COLPOSCOPY       OB History    Gravida  7   Para  4   Term  4   Preterm  0   AB  2   Living  4     SAB  2   TAB      Ectopic      Multiple      Live Births  1            Home Medications    Prior to Admission medications   Medication Sig Start Date End Date Taking? Authorizing Provider  cephALEXin (KEFLEX) 500 MG capsule Take 1 capsule (500 mg total) by mouth 2 (two) times daily for 7 days. 12/04/17 12/11/17  Alvira Monday, MD  clotrimazole (GYNE-LOTRIMIN) 1 % vaginal cream Place 1 Applicatorful vaginally at bedtime for 7 days. 12/04/17 12/11/17  Alvira Monday, MD  meclizine (ANTIVERT) 25 MG tablet Take 1 tablet (25 mg total) by mouth 3 (three) times daily as needed for dizziness or nausea. 12/04/17   Alvira Monday, MD  metroNIDAZOLE (FLAGYL) 500 MG tablet Take 1 tablet (500 mg total) by mouth 2 (two) times daily for 7  days. 12/04/17 12/11/17  Alvira Monday, MD    Family History Family History  Problem Relation Age of Onset  . Diabetes Mother     Social History Social History   Tobacco Use  . Smoking status: Never Smoker  . Smokeless tobacco: Never Used  Substance Use Topics  . Alcohol use: No  . Drug use: No     Allergies   Penicillins; Latex; Percocet [oxycodone-acetaminophen]; Reglan [metoclopramide]; and Clindamycin/lincomycin   Review of Systems Review of Systems  Constitutional: Negative for fever.  HENT: Negative for sore throat.   Eyes: Negative for visual disturbance.  Respiratory: Negative for cough and shortness of breath.   Cardiovascular: Negative for chest pain.  Gastrointestinal: Positive for nausea and vomiting. Negative for abdominal pain.  Genitourinary: Positive for dysuria and vaginal discharge. Negative for difficulty urinating.  Musculoskeletal: Negative for back pain and neck pain.  Skin: Negative for rash.  Neurological: Negative for syncope and headaches.     Physical Exam Updated Vital Signs BP 123/77 (BP Location: Right Arm)   Pulse 82   Temp 98.3 F (36.8 C) (Oral)   Resp 18   Ht 5\' 6"  (1.676  m)   Wt 90.7 kg   LMP 08/06/2017   SpO2 100%   BMI 32.28 kg/m   Physical Exam  Constitutional: She is oriented to person, place, and time. She appears well-developed and well-nourished. No distress.  HENT:  Head: Normocephalic and atraumatic.  Eyes: Conjunctivae and EOM are normal.  Neck: Normal range of motion.  Cardiovascular: Normal rate, regular rhythm, normal heart sounds and intact distal pulses. Exam reveals no gallop and no friction rub.  No murmur heard. Pulmonary/Chest: Effort normal and breath sounds normal. No respiratory distress. She has no wheezes. She has no rales.  Abdominal: Soft. She exhibits no distension. There is no tenderness. There is no guarding.  Musculoskeletal: She exhibits no edema or tenderness.  Neurological: She is  alert and oriented to person, place, and time.  Skin: Skin is warm and dry. No rash noted. She is not diaphoretic. No erythema.  Nursing note and vitals reviewed.    ED Treatments / Results  Labs (all labs ordered are listed, but only abnormal results are displayed) Labs Reviewed  WET PREP, GENITAL - Abnormal; Notable for the following components:      Result Value   Yeast Wet Prep HPF POC PRESENT (*)    Clue Cells Wet Prep HPF POC PRESENT (*)    WBC, Wet Prep HPF POC MANY (*)    All other components within normal limits  URINALYSIS, ROUTINE W REFLEX MICROSCOPIC - Abnormal; Notable for the following components:   Glucose, UA 250 (*)    Leukocytes, UA SMALL (*)    All other components within normal limits  URINALYSIS, MICROSCOPIC (REFLEX) - Abnormal; Notable for the following components:   Bacteria, UA MANY (*)    All other components within normal limits  CBC WITH DIFFERENTIAL/PLATELET - Abnormal; Notable for the following components:   Hemoglobin 11.1 (*)    HCT 34.5 (*)    MCV 75.3 (*)    MCH 24.2 (*)    RDW 17.4 (*)    All other components within normal limits  COMPREHENSIVE METABOLIC PANEL - Abnormal; Notable for the following components:   Sodium 134 (*)    CO2 21 (*)    Albumin 3.2 (*)    All other components within normal limits  GC/CHLAMYDIA PROBE AMP ([place]) NOT AT [facility]    EKG None  Radiology US Ob Comp Less 14 Wks  Result Date: 12/04/2017 CLINICAL DATA:  Right lower abdominal pain. EXAM: OBSTETRIC <14 WK ULTRASOUND TECHNIQUE: Transabdominal ultrasound was performed for evaluation of the gestation as well as the maternal uterus and adnexal regions. COMPARISON:  10/21/2017 FINDINGS: Intrauterine gestational sac: Single Yolk sac:  Not visualized Embryo:  Visualized Cardiac Activity: Visualized Heart Rate: 160 bpm CRL:   60.4 mm   12 w 4 d                  [place] EDC: 06/14/2018 Subchorionic hemorrhage:  None visualized. Maternal uterus/adnexae: Unremarkable.  IMPRESSION: Single living IUP as above. Electronically Signed   By: Sebastian Ache M.D.   On: 12/04/2017 12:49    Procedures Procedures (including critical care time)  Medications Ordered in ED Medications  sodium chloride 0.9 % bolus 1,000 mL ( Intravenous Stopped 12/04/17 1146)  ondansetron (ZOFRAN) injection 4 mg (4 mg Intravenous Given 12/04/17 1044)     Initial Impression / Assessment and Plan / ED Course  I have reviewed the triage vital signs and the nursing notes.  Pertinent labs & imaging results that were  available during my care of the patient were reviewed by me and considered in my medical decision making (see chart for details).     32yo [redacted]wk pregnant presenting with n/v, abdominal cramping, vaginal itching, dysuria.  [place] without abnormalities.  Doubt appendicitis given low ppetite, nausea have been present over onger period of time and likely associated with pregnancy. No specific R sided pain.  N/V, no significant electrolyte abnormalities.   Pelvic exam shows candida and BV on wet prep. UA concerning for possible infection. Given symptoms of all, will treat with vaginal clotrimazole, as well as flagyl for BV and keflex for UTI.  Prior to discharge, pt reports she has abscess under her left axilla. On exam has small draining abscess. Recommended I/d, pt refuses. Given area small, currently draining with pt beginning abx, discussed she may use warm compresses and continue to monitor the area closely and return if symptoms worsen.  Patient discharged in stable condition with understanding of reasons to return.   Final Clinical Impressions(s) / ED Diagnoses   Final diagnoses:  Lower abdominal pain  Bacterial vaginosis  Vaginal candidiasis  Urinary tract infection without hematuria, site unspecified  Nausea and vomiting of pregnancy, antepartum    ED Discharge Orders         Ordered    clotrimazole (GYNE-LOTRIMIN) 1 % vaginal cream  Daily at bedtime     12/04/17 1251     metroNIDAZOLE (FLAGYL) 500 MG tablet  2 times daily     12/04/17 1251    cephALEXin (KEFLEX) 500 MG capsule  2 times daily     12/04/17 1251    meclizine (ANTIVERT) 25 MG tablet  3 times daily PRN     12/04/17 1254           Alvira Monday, MD 12/04/17 2005

## 2017-12-04 NOTE — ED Triage Notes (Signed)
Suprapubic abd pain and low left back pain that started this morning.  Denies any vaginal discharge or bleeding.  Sts she is [redacted] weeks pregnant. This is preg # 6.  Last pregnancy had 1st trimester miscarriage.

## 2017-12-04 NOTE — Discharge Instructions (Signed)
I have sent meclizine rx to your pharmacy fo rnausea, if you take this medication please discontinue the doxylamine/unasom

## 2017-12-04 NOTE — ED Notes (Signed)
ED Provider at bedside. 

## 2017-12-07 LAB — GC/CHLAMYDIA PROBE AMP (~~LOC~~) NOT AT ARMC
Chlamydia: NEGATIVE
NEISSERIA GONORRHEA: NEGATIVE

## 2018-02-17 DIAGNOSIS — Z3A23 23 weeks gestation of pregnancy: Secondary | ICD-10-CM | POA: Diagnosis not present

## 2018-02-17 DIAGNOSIS — Z3482 Encounter for supervision of other normal pregnancy, second trimester: Secondary | ICD-10-CM | POA: Diagnosis not present

## 2018-02-17 DIAGNOSIS — Z113 Encounter for screening for infections with a predominantly sexual mode of transmission: Secondary | ICD-10-CM | POA: Diagnosis not present

## 2018-02-17 LAB — OB RESULTS CONSOLE GC/CHLAMYDIA
Chlamydia: NEGATIVE
Gonorrhea: NEGATIVE

## 2018-02-17 LAB — OB RESULTS CONSOLE HIV ANTIBODY (ROUTINE TESTING): HIV: NONREACTIVE

## 2018-02-17 LAB — OB RESULTS CONSOLE HEPATITIS B SURFACE ANTIGEN: Hepatitis B Surface Ag: NEGATIVE

## 2018-02-17 LAB — OB RESULTS CONSOLE ABO/RH: RH Type: POSITIVE

## 2018-02-17 LAB — OB RESULTS CONSOLE RPR: RPR: NONREACTIVE

## 2018-02-17 LAB — OB RESULTS CONSOLE RUBELLA ANTIBODY, IGM: Rubella: IMMUNE

## 2018-02-17 LAB — OB RESULTS CONSOLE ANTIBODY SCREEN: Antibody Screen: NEGATIVE

## 2018-03-03 NOTE — L&D Delivery Note (Signed)
Delivery Note At  a viable female was delivered via  (Presentation:OA ;  ).  APGAR:8 ,9 ; weight pending  .   Placenta status:complete , . 3VCord:  with the following complications:None .  Anesthesia:  None Episiotomy:  None Lacerations:  None Suture Repair: N/A Est. Blood Loss (mL):75cc    Mom to postpartum.  Baby to Couplet care / Skin to Skin.  Patricia Macdonald [name] 06/20/2018, 6:44 AM

## 2018-03-16 DIAGNOSIS — Z363 Encounter for antenatal screening for malformations: Secondary | ICD-10-CM | POA: Diagnosis not present

## 2018-03-16 DIAGNOSIS — Z3A27 27 weeks gestation of pregnancy: Secondary | ICD-10-CM | POA: Diagnosis not present

## 2018-03-16 DIAGNOSIS — Z3492 Encounter for supervision of normal pregnancy, unspecified, second trimester: Secondary | ICD-10-CM | POA: Diagnosis not present

## 2018-03-16 DIAGNOSIS — R81 Glycosuria: Secondary | ICD-10-CM | POA: Diagnosis not present

## 2018-03-17 ENCOUNTER — Encounter (HOSPITAL_COMMUNITY): Payer: Self-pay | Admitting: *Deleted

## 2018-03-17 ENCOUNTER — Inpatient Hospital Stay (HOSPITAL_COMMUNITY)
Admission: AD | Admit: 2018-03-17 | Discharge: 2018-03-17 | Disposition: A | Discharge status: Home / Self Care | Payer: 59 | Attending: Obstetrics and Gynecology | Admitting: Obstetrics and Gynecology

## 2018-03-17 DIAGNOSIS — Z3689 Encounter for other specified antenatal screening: Secondary | ICD-10-CM

## 2018-03-17 DIAGNOSIS — O26893 Other specified pregnancy related conditions, third trimester: Secondary | ICD-10-CM | POA: Insufficient documentation

## 2018-03-17 DIAGNOSIS — R109 Unspecified abdominal pain: Secondary | ICD-10-CM | POA: Diagnosis present

## 2018-03-17 DIAGNOSIS — E860 Dehydration: Secondary | ICD-10-CM

## 2018-03-17 DIAGNOSIS — Z880 Allergy status to penicillin: Secondary | ICD-10-CM | POA: Diagnosis not present

## 2018-03-17 DIAGNOSIS — O4702 False labor before 37 completed weeks of gestation, second trimester: Secondary | ICD-10-CM | POA: Diagnosis not present

## 2018-03-17 DIAGNOSIS — Z3A27 27 weeks gestation of pregnancy: Secondary | ICD-10-CM

## 2018-03-17 LAB — URINALYSIS, ROUTINE W REFLEX MICROSCOPIC
Bilirubin Urine: NEGATIVE
Hgb urine dipstick: NEGATIVE
Ketones, ur: 20 mg/dL — AB
Leukocytes, UA: NEGATIVE
Nitrite: NEGATIVE
PH: 6 (ref 5.0–8.0)
Protein, ur: 30 mg/dL — AB
Specific Gravity, Urine: 1.029 (ref 1.005–1.030)

## 2018-03-17 LAB — CBC WITH DIFFERENTIAL/PLATELET
BASOS ABS: 0 10*3/uL (ref 0.0–0.1)
Basophils Relative: 0 %
Eosinophils Absolute: 0.1 10*3/uL (ref 0.0–0.5)
Eosinophils Relative: 1 %
HEMATOCRIT: 33.3 % — AB (ref 36.0–46.0)
HEMOGLOBIN: 10 g/dL — AB (ref 12.0–15.0)
LYMPHS ABS: 1.3 10*3/uL (ref 0.7–4.0)
Lymphocytes Relative: 15 %
MCH: 23.1 pg — AB (ref 26.0–34.0)
MCHC: 30 g/dL (ref 30.0–36.0)
MCV: 76.9 fL — ABNORMAL LOW (ref 80.0–100.0)
MONOS PCT: 7 %
Monocytes Absolute: 0.6 10*3/uL (ref 0.1–1.0)
NEUTROS ABS: 6.8 10*3/uL (ref 1.7–7.7)
NEUTROS PCT: 77 %
NRBC: 0.2 % (ref 0.0–0.2)
Platelets: 216 10*3/uL (ref 150–400)
RBC: 4.33 MIL/uL (ref 3.87–5.11)
RDW: 18.3 % — ABNORMAL HIGH (ref 11.5–15.5)
WBC: 8.8 10*3/uL (ref 4.0–10.5)

## 2018-03-17 MED ORDER — NIFEDIPINE 10 MG PO CAPS
10.0000 mg | ORAL_CAPSULE | ORAL | Status: AC
  Administered 2018-03-17 (×2): 10 mg via ORAL
  Filled 2018-03-17 (×3): qty 1

## 2018-03-17 MED ORDER — LACTATED RINGERS IV BOLUS
1000.0000 mL | Freq: Once | INTRAVENOUS | Status: AC
  Administered 2018-03-17: 1000 mL via INTRAVENOUS

## 2018-03-17 NOTE — MAU Note (Signed)
Pt arrives via EMS with complaint of sudden onset of sharp right lower abd pain at 1300, pt sates she tried sitting down and resting and the pain worsened and was really sharp. Had one episode of nausea and felt as if she was going to pass out. Denies bleeding.

## 2018-03-17 NOTE — Discharge Instructions (Signed)
Braxton Hicks Contractions Contractions of the uterus can occur throughout pregnancy, but they are not always a sign that you are in labor. You may have practice contractions called Braxton Hicks contractions. These false labor contractions are sometimes confused with true labor. What are Braxton Hicks contractions? Braxton Hicks contractions are tightening movements that occur in the muscles of the uterus before labor. Unlike true labor contractions, these contractions do not result in opening (dilation) and thinning of the cervix. Toward the end of pregnancy (32-34 weeks), Braxton Hicks contractions can happen more often and may become stronger. These contractions are sometimes difficult to tell apart from true labor because they can be very uncomfortable. You should not feel embarrassed if you go to the hospital with false labor. Sometimes, the only way to tell if you are in true labor is for your health care provider to look for changes in the cervix. The health care provider will do a physical exam and may monitor your contractions. If you are not in true labor, the exam should show that your cervix is not dilating and your water has not broken. If there are no other health problems associated with your pregnancy, it is completely safe for you to be sent home with false labor. You may continue to have Braxton Hicks contractions until you go into true labor. How to tell the difference between true labor and false labor True labor  Contractions last 30-70 seconds.  Contractions become very regular.  Discomfort is usually felt in the top of the uterus, and it spreads to the lower abdomen and low back.  Contractions do not go away with walking.  Contractions usually become more intense and increase in frequency.  The cervix dilates and gets thinner. False labor  Contractions are usually shorter and not as strong as true labor contractions.  Contractions are usually irregular.  Contractions  are often felt in the front of the lower abdomen and in the groin.  Contractions may go away when you walk around or change positions while lying down.  Contractions get weaker and are shorter-lasting as time goes on.  The cervix usually does not dilate or become thin. Follow these instructions at home:   Take over-the-counter and prescription medicines only as told by your health care provider.  Keep up with your usual exercises and follow other instructions from your health care provider.  Eat and drink lightly if you think you are going into labor.  If Braxton Hicks contractions are making you uncomfortable: ? Change your position from lying down or resting to walking, or change from walking to resting. ? Sit and rest in a tub of warm water. ? Drink enough fluid to keep your urine pale yellow. Dehydration may cause these contractions. ? Do slow and deep breathing several times an hour.  Keep all follow-up prenatal visits as told by your health care provider. This is important. Contact a health care provider if:  You have a fever.  You have continuous pain in your abdomen. Get help right away if:  Your contractions become stronger, more regular, and closer together.  You have fluid leaking or gushing from your vagina.  You pass blood-tinged mucus (bloody show).  You have bleeding from your vagina.  You have low back pain that you never had before.  You feel your baby's head pushing down and causing pelvic pressure.  Your baby is not moving inside you as much as it used to. Summary  Contractions that occur before labor are   called Braxton Hicks contractions, false labor, or practice contractions.  Braxton Hicks contractions are usually shorter, weaker, farther apart, and less regular than true labor contractions. True labor contractions usually become progressively stronger and regular, and they become more frequent.  Manage discomfort from Braxton Hicks contractions  by changing position, resting in a warm bath, drinking plenty of water, or practicing deep breathing. This information is not intended to replace advice given to you by your health care provider. Make sure you discuss any questions you have with your health care provider. Document Released: 07/03/2016 Document Revised: 12/02/2016 Document Reviewed: 07/03/2016 Elsevier Interactive Patient Education  2019 [employer]  

## 2018-03-17 NOTE — MAU Provider Note (Signed)
History     CSN: [id number]  Arrival date and time: 03/17/18 1421   First Provider Initiated Contact with Patient 03/17/18 1454      Chief Complaint  Patient presents with  . Abdominal Pain  . Nausea   [id number] @27 .3 wks presenting with abdominal pain. Reports episode of sharp, intermittent pain on right side, feels like ctx. Frequency is q2 min. Shortly after onset she felt cold, clammy, nauseas and lightheaded. Denies VB and LOF. No urinary sx. Feeling good FM. Admits to poor water intake today, only 2 cups. Reports pain is improving. Her pregnancy has been uncomplicated.   OB History    Gravida  7   Para  4   Term  4   Preterm  0   AB  2   Living  4     SAB  2   TAB      Ectopic      Multiple      Live Births  1           Past Medical History:  Diagnosis Date  . Abnormal Pap smear of cervix   . Anemia   . Blood transfusion 2005  . Breast mass 04/17/2016   left breast lump x 400 x 3 weeks with pain  . Postpartum hemorrhage 09/2003    Past Surgical History:  Procedure Laterality Date  . COLPOSCOPY      Family History  Problem Relation Age of Onset  . Diabetes Mother     Social History   Tobacco Use  . Smoking status: Never Smoker  . Smokeless tobacco: Never Used  Substance Use Topics  . Alcohol use: No  . Drug use: No    Allergies:  Allergies  Allergen Reactions  . Penicillins Anaphylaxis    Has patient had a PCN reaction causing immediate rash, facial/tongue/throat swelling, SOB or lightheadedness with hypotension:  yes Has patient had a PCN reaction causing severe rash involving mucus membranes or skin necrosis:  no Has patient had a PCN reaction that required hospitalization: yes Has patient had a PCN reaction occurring within the last 10 years:no If all of the above answers are "NO", then may proceed with Cephalosporin use.   . Latex Hives and Swelling  . Percocet [Oxycodone-Acetaminophen] Nausea Only    Patient can tolerate  acetaminophen solely  . Reglan [Metoclopramide] Other (See Comments)    Akathisia   . Clindamycin/Lincomycin Other (See Comments)    Burning     Medications Prior to Admission  Medication Sig Dispense Refill Last Dose  . Prenatal Vit-Fe Fumarate-FA (PRENATAL MULTIVITAMIN) TABS tablet Take 1 tablet by mouth daily at 12 noon.   03/16/2018 at Unknown time  . meclizine (ANTIVERT) 25 MG tablet Take 1 tablet (25 mg total) by mouth 3 (three) times daily as needed for dizziness or nausea. (Patient not taking: Reported on 03/17/2018) 30 tablet 0 Not Taking at Unknown time    Review of Systems  Constitutional: Negative for chills and fever.  Gastrointestinal: Positive for abdominal pain and nausea. Negative for vomiting.  Genitourinary: Negative for dysuria, frequency, urgency, vaginal bleeding and vaginal discharge.   Physical Exam   Blood pressure 131/79, pulse 94, temperature 98.3 F (36.8 C), temperature source Oral, resp. rate 16, height 5\' 6"  (1.676 m), weight 107.5 kg, last menstrual period 08/06/2017, SpO2 98 %, unknown if currently breastfeeding.  Physical Exam  Nursing note and vitals reviewed. Constitutional: She is oriented to person, place, and time. She appears well-developed and  well-nourished. No distress (appear comfortable).  HENT:  Head: Normocephalic and atraumatic.  Neck: Normal range of motion.  Cardiovascular: Normal rate.  Respiratory: Effort normal. No respiratory distress.  GI: Soft. She exhibits no distension. There is no abdominal tenderness. There is no rebound.  gravid  Genitourinary:    Genitourinary Comments: SVE: closed/thick;  Cervix: nabothian cyst vs polyp present   Musculoskeletal: Normal range of motion.  Neurological: She is alert and oriented to person, place, and time.  Skin: Skin is warm and dry.  Psychiatric: She has a normal mood and affect.  EFM: 140 bpm, mod variability, + accels, no decels Toco: irregular  Results for orders placed or  performed during the hospital encounter of 03/17/18 (from the past 24 hour(s))  Urinalysis, Routine w reflex microscopic     Status: Abnormal   Collection Time: 03/17/18  2:27 PM  Result Value Ref Range   Color, Urine YELLOW YELLOW   APPearance CLEAR CLEAR   Specific Gravity, Urine 1.029 1.005 - 1.030   pH 6.0 5.0 - 8.0   Glucose, UA >=500 (A) NEGATIVE mg/dL   Hgb urine dipstick NEGATIVE NEGATIVE   Bilirubin Urine NEGATIVE NEGATIVE   Ketones, ur 20 (A) NEGATIVE mg/dL   Protein, ur 30 (A) NEGATIVE mg/dL   Nitrite NEGATIVE NEGATIVE   Leukocytes, UA NEGATIVE NEGATIVE   RBC / HPF 0-5 0 - 5 RBC/hpf   WBC, UA 0-5 0 - 5 WBC/hpf   Bacteria, UA RARE (A) NONE SEEN   Squamous Epithelial / LPF 0-5 0 - 5   Mucus PRESENT   CBC with Differential/Platelet     Status: Abnormal   Collection Time: 03/17/18  2:51 PM  Result Value Ref Range   WBC 8.8 4.0 - 10.5 K/uL   RBC 4.33 3.87 - 5.11 MIL/uL   Hemoglobin 10.0 (L) 12.0 - 15.0 g/dL   HCT 16.133.3 (L) 09.636.0 - 04.546.0 %   MCV 76.9 (L) 80.0 - 100.0 fL   MCH 23.1 (L) 26.0 - 34.0 pg   MCHC 30.0 30.0 - 36.0 g/dL   RDW 40.918.3 (H) 81.111.5 - 91.415.5 %   Platelets 216 150 - 400 K/uL   nRBC 0.2 0.0 - 0.2 %   Neutrophils Relative % 77 %   Neutro Abs 6.8 1.7 - 7.7 K/uL   Lymphocytes Relative 15 %   Lymphs Abs 1.3 0.7 - 4.0 K/uL   Monocytes Relative 7 %   Monocytes Absolute 0.6 0.1 - 1.0 K/uL   Eosinophils Relative 1 %   Eosinophils Absolute 0.1 0.0 - 0.5 K/uL   Basophils Relative 0 %   Basophils Absolute 0.0 0.0 - 0.1 K/uL   MAU Course  Procedures Orders Placed This Encounter  Procedures  . Urinalysis, Routine w reflex microscopic    Standing Status:   Standing    Number of Occurrences:   1  . CBC with Differential/Platelet    Standing Status:   Standing    Number of Occurrences:   1   Meds ordered this encounter  Medications  . lactated ringers bolus 1,000 mL  . NIFEdipine (PROCARDIA) capsule 10 mg   MDM Labs ordered and reviewed. No evidence of PTL.  Feeling better after Procardia x2. SVE unchanged. Stable for discharge home.   Assessment and Plan   1. [redacted] weeks gestation of pregnancy   2. NST (non-stress test) reactive   3. Preterm uterine contractions in second trimester, antepartum   4. Dehydration    Discharge home Follow up at  [facility] as scheduled PTL precautions Hydrate with 5-6 bottles water per day  Allergies as of 03/17/2018      Reactions   Penicillins Anaphylaxis   Has patient had a PCN reaction causing immediate rash, facial/tongue/throat swelling, SOB or lightheadedness with hypotension:  yes Has patient had a PCN reaction causing severe rash involving mucus membranes or skin necrosis:  no Has patient had a PCN reaction that required hospitalization: yes Has patient had a PCN reaction occurring within the last 10 years:no If all of the above answers are "NO", then may proceed with Cephalosporin use.   Latex Hives, Swelling   Percocet [oxycodone-acetaminophen] Nausea Only   Patient can tolerate acetaminophen solely   Reglan [metoclopramide] Other (See Comments)   Akathisia    Clindamycin/lincomycin Other (See Comments)   Burning      Medication List    STOP taking these medications   meclizine 25 MG tablet Commonly known as:  ANTIVERT     TAKE these medications   prenatal multivitamin Tabs tablet Take 1 tablet by mouth daily at 12 noon.      Donette Larry, CNM 03/17/2018, 5:29 PM

## 2018-03-30 DIAGNOSIS — O403XX1 Polyhydramnios, third trimester, fetus 1: Secondary | ICD-10-CM | POA: Diagnosis not present

## 2018-03-30 DIAGNOSIS — Z3483 Encounter for supervision of other normal pregnancy, third trimester: Secondary | ICD-10-CM | POA: Diagnosis not present

## 2018-03-30 DIAGNOSIS — Z3A29 29 weeks gestation of pregnancy: Secondary | ICD-10-CM | POA: Diagnosis not present

## 2018-03-30 DIAGNOSIS — R81 Glycosuria: Secondary | ICD-10-CM | POA: Diagnosis not present

## 2018-04-13 DIAGNOSIS — O403XX1 Polyhydramnios, third trimester, fetus 1: Secondary | ICD-10-CM | POA: Diagnosis not present

## 2018-04-13 DIAGNOSIS — Z3A31 31 weeks gestation of pregnancy: Secondary | ICD-10-CM | POA: Diagnosis not present

## 2018-04-20 DIAGNOSIS — O99213 Obesity complicating pregnancy, third trimester: Secondary | ICD-10-CM | POA: Diagnosis not present

## 2018-04-20 DIAGNOSIS — Z3A32 32 weeks gestation of pregnancy: Secondary | ICD-10-CM | POA: Diagnosis not present

## 2018-04-20 DIAGNOSIS — O403XX0 Polyhydramnios, third trimester, not applicable or unspecified: Secondary | ICD-10-CM | POA: Diagnosis not present

## 2018-04-27 DIAGNOSIS — O403XX0 Polyhydramnios, third trimester, not applicable or unspecified: Secondary | ICD-10-CM | POA: Diagnosis not present

## 2018-04-27 DIAGNOSIS — L299 Pruritus, unspecified: Secondary | ICD-10-CM | POA: Diagnosis not present

## 2018-04-27 DIAGNOSIS — O99213 Obesity complicating pregnancy, third trimester: Secondary | ICD-10-CM | POA: Diagnosis not present

## 2018-04-27 DIAGNOSIS — Z3A33 33 weeks gestation of pregnancy: Secondary | ICD-10-CM | POA: Diagnosis not present

## 2018-05-03 ENCOUNTER — Inpatient Hospital Stay (HOSPITAL_COMMUNITY)
Admission: AD | Admit: 2018-05-03 | Discharge: 2018-05-03 | Disposition: A | Discharge status: Home / Self Care | Payer: 59 | Attending: Obstetrics & Gynecology | Admitting: Obstetrics & Gynecology

## 2018-05-03 ENCOUNTER — Encounter (HOSPITAL_COMMUNITY): Payer: Self-pay | Admitting: *Deleted

## 2018-05-03 DIAGNOSIS — O26899 Other specified pregnancy related conditions, unspecified trimester: Secondary | ICD-10-CM

## 2018-05-03 DIAGNOSIS — O4703 False labor before 37 completed weeks of gestation, third trimester: Secondary | ICD-10-CM | POA: Diagnosis not present

## 2018-05-03 DIAGNOSIS — Z880 Allergy status to penicillin: Secondary | ICD-10-CM | POA: Insufficient documentation

## 2018-05-03 DIAGNOSIS — Z3A34 34 weeks gestation of pregnancy: Secondary | ICD-10-CM | POA: Diagnosis not present

## 2018-05-03 DIAGNOSIS — R102 Pelvic and perineal pain: Secondary | ICD-10-CM | POA: Insufficient documentation

## 2018-05-03 DIAGNOSIS — O479 False labor, unspecified: Secondary | ICD-10-CM

## 2018-05-03 DIAGNOSIS — R12 Heartburn: Secondary | ICD-10-CM | POA: Diagnosis not present

## 2018-05-03 DIAGNOSIS — O4700 False labor before 37 completed weeks of gestation, unspecified trimester: Secondary | ICD-10-CM

## 2018-05-03 DIAGNOSIS — O403XX0 Polyhydramnios, third trimester, not applicable or unspecified: Secondary | ICD-10-CM | POA: Diagnosis not present

## 2018-05-03 LAB — URINALYSIS, ROUTINE W REFLEX MICROSCOPIC
Bilirubin Urine: NEGATIVE
GLUCOSE, UA: 150 mg/dL — AB
HGB URINE DIPSTICK: NEGATIVE
Ketones, ur: 5 mg/dL — AB
Leukocytes,Ua: NEGATIVE
Nitrite: NEGATIVE
PROTEIN: NEGATIVE mg/dL
Specific Gravity, Urine: 1.016 (ref 1.005–1.030)
pH: 7 (ref 5.0–8.0)

## 2018-05-03 MED ORDER — NIFEDIPINE 10 MG PO CAPS
20.0000 mg | ORAL_CAPSULE | ORAL | Status: DC
  Administered 2018-05-03 (×2): 20 mg via ORAL
  Filled 2018-05-03 (×2): qty 2

## 2018-05-03 NOTE — Discharge Instructions (Signed)
Activity Restriction During Pregnancy °Your health care provider may recommend specific activity restrictions during pregnancy for a variety of reasons. Activity restriction may require that you limit activities that require great effort, such as exercise, lifting, or sex. °The type of activity restriction will vary for each person, depending on your risk or the problems you are having. Activity restriction may be recommended for a period of time until your baby is delivered. °Why are activity restrictions recommended? °Activity restriction may be recommended if: °· Your placenta is partially or completely covering the opening of your cervix (placenta previa). °· There is bleeding between the wall of the uterus and the amniotic sac in the first trimester of pregnancy (subchorionic hemorrhage). °· You went into labor too early (preterm labor). °· You have a history of miscarriage. °· You have a condition that causes high blood pressure during pregnancy (preeclampsia or eclampsia). °· You are pregnant with more than one baby. °· Your baby is not growing well. °What are the risks? °The risks depend on your specific restriction. Strict bed rest has the most physical and emotional risks and is no longer routinely recommended. Risks of strict bed rest include: °· Loss of muscle conditioning from not moving. °· Blood clots. °· Social isolation. °· Depression. °· Loss of income. °Talk with your health care team about activity restriction to decide if it is best for you and your baby. Even if you are having problems during your pregnancy, you may be able to continue with normal levels of activity with careful monitoring by your health care team. °Follow these instructions at home: °If needed, based on your overall health and the health of your baby, your health care provider will decide which type of activity restriction is right for you. Activity restrictions may include: °· Not lifting anything heavier than 10 pounds (4.5  kg). °· Avoiding activities that take a lot of physical effort. °· No lifting or straining. °· Resting in a sitting position or lying down for periods of time during the day. °Pelvic rest may be recommended along with activity restrictions. If pelvic rest is recommended, then: °· Do not have sex, an orgasm, or use sexual stimulation. °· Do not use tampons. Do not douche. Do not put anything into your vagina. °· Do not lift anything that is heavier than 10 lb (4.5 kg). °· Avoid activities that require a lot of effort. °· Avoid any activity in which your pelvic muscles could become strained, such as squatting. °Questions to ask your health care provider °· Why is my activity being limited? °· How will activity restrictions affect my body? °· Why is rest helpful for me and my baby? °· What activities can I do? °· When can I return to normal activities? °When should I seek immediate medical care? °Seek immediate medical care if you have: °· Vaginal bleeding. °· Vaginal discharge. °· Cramping pain in your lower abdomen. °· Regular contractions. °· A low, dull backache. °Summary °· Your health care provider may recommend specific activity restrictions during pregnancy for a variety of reasons. °· Activity restriction may require that you limit activities such as exercise, lifting, sex, or any other activity that requires great effort. °· Discuss the risks and benefits of activity restriction with your health care team to decide if it is best for you and your baby. °· Contact your health care provider right away if you think you are having contractions, or if you notice vaginal bleeding, discharge, or cramping. °This information is not   intended to replace advice given to you by your health care provider. Make sure you discuss any questions you have with your health care provider. Document Released: 06/14/2010 Document Revised: 06/09/2017 Document Reviewed: 06/09/2017 Elsevier Interactive Patient Education  2019 [employer]   Abdominal Pain During Pregnancy  Belly (abdominal) pain is common during pregnancy. There are many possible causes. Most of the time, it is not a serious problem. Other times, it can be a sign that something is wrong with the pregnancy. Always tell your doctor if you have belly pain. Follow these instructions at home:  Do not have sex or put anything in your vagina until your pain goes away completely.  Get plenty of rest until your pain gets better.  Drink enough fluid to keep your pee (urine) pale yellow.  Take over-the-counter and prescription medicines only as told by your doctor.  Keep all follow-up visits as told by your doctor. This is important. Contact a doctor if:  Your pain continues or gets worse after resting.  You have lower belly pain that: ? Comes and goes at regular times. ? Spreads to your back. ? Feels like menstrual cramps.  You have pain or burning when you pee (urinate). Get help right away if:  You have a fever or chills.  You have vaginal bleeding.  You are leaking fluid from your vagina.  You are passing tissue from your vagina.  You throw up (vomit) for more than 24 hours.  You have watery poop (diarrhea) for more than 24 hours.  Your baby is moving less than usual.  You feel very weak or faint.  You have shortness of breath.  You have very bad pain in your upper belly. Summary  Belly (abdominal) pain is common during pregnancy. There are many possible causes.  If you have belly pain during pregnancy, tell your doctor right away.  Keep all follow-up visits as told by your doctor. This is important. This information is not intended to replace advice given to you by your health care provider. Make sure you discuss any questions you have with your health care provider. Document Released: 02/05/2009 Document Revised: 05/22/2016 Document Reviewed: 05/22/2016 Elsevier Interactive Patient Education  2019 [employer].

## 2018-05-03 NOTE — MAU Note (Signed)
Pt reports vaginal pressure since this am, denies contractions, denies bleeding. Reports good fetal movement.

## 2018-05-03 NOTE — MAU Provider Note (Signed)
Chief Complaint:  Pelvic Pain   None    HPI: Patricia Macdonald is a 33 y.o. [id number] at [redacted]w[redacted]d who presents to maternity admissions reporting that she woke with a new waddle in her walk and had increased pelvic pressure, pt was concerned about this. Pt denies dysuria, no vaginal discharge, denies taking anything to make it better or worse. Denies contractions, leakage of fluid or vaginal bleeding. Good fetal movement.   Pregnancy Course:   Past Medical History:  Diagnosis Date  . Abnormal Pap smear of cervix   . Anemia   . Blood transfusion 2005  . Breast mass 04/17/2016   left breast lump x 400 x 3 weeks with pain  . Postpartum hemorrhage 09/2003   OB History  Gravida Para Term Preterm AB Living  7 4 4  0 2 4  SAB TAB Ectopic Multiple Live Births  2       1    # Outcome Date GA Lbr Len/2nd Weight Sex Delivery Anes PTL Lv  7 Current           6 SAB 2014          5 Term 02/27/11 [redacted]w[redacted]d  3820 g M Vag-Spont None  LIV     Birth Comments: WDL  4 Term 2010 [redacted]w[redacted]d    Vag-Spont     3 Term 2007     Vag-Spont     2 Term 2005     Vag-Spont     1 SAB            Past Surgical History:  Procedure Laterality Date  . COLPOSCOPY     Family History  Problem Relation Age of Onset  . Diabetes Mother    Social History   Tobacco Use  . Smoking status: Never Smoker  . Smokeless tobacco: Never Used  Substance Use Topics  . Alcohol use: No  . Drug use: No   Allergies  Allergen Reactions  . Penicillins Anaphylaxis    Has patient had a PCN reaction causing immediate rash, facial/tongue/throat swelling, SOB or lightheadedness with hypotension:  yes Has patient had a PCN reaction causing severe rash involving mucus membranes or skin necrosis:  no Has patient had a PCN reaction that required hospitalization: yes Has patient had a PCN reaction occurring within the last 10 years:no If all of the above answers are "NO", then may proceed with Cephalosporin use.   . Latex Hives and Swelling  .  Percocet [Oxycodone-Acetaminophen] Nausea Only    Patient can tolerate acetaminophen solely  . Reglan [Metoclopramide] Other (See Comments)    Akathisia   . Clindamycin/Lincomycin Other (See Comments)    Burning    Medications Prior to Admission  Medication Sig Dispense Refill Last Dose  . Prenatal Vit-Fe Fumarate-FA (PRENATAL MULTIVITAMIN) TABS tablet Take 1 tablet by mouth daily at 12 noon.   03/16/2018 at Unknown time    I have reviewed patient's Past Medical Hx, Surgical Hx, Family Hx, Social Hx, medications and allergies.   ROS:  Review of Systems  Constitutional: Negative.   HENT: Negative.   Eyes: Negative.   Respiratory: Negative.   Cardiovascular: Negative.   Gastrointestinal: Negative.   Endocrine: Negative.   Genitourinary: Negative.        Pelvic pressure  Musculoskeletal: Negative.        Waddle walk  Skin: Negative.   Allergic/Immunologic: Negative.   Neurological: Negative.   Hematological: Negative.   Psychiatric/Behavioral: Negative.   All other systems reviewed and are  negative.   Physical Exam   Patient Vitals for the past 24 hrs:  BP Temp Pulse Resp SpO2 Height Weight  05/03/18 1725 134/69 - - - - - -  05/03/18 1704 123/72 - - - - - -  05/03/18 1450 119/82 98.7 F (37.1 C) 84 18 100 % 5\' 6"  (1.676 m) 108.9 kg   Constitutional: Well-developed, well-nourished female in no acute distress.  Cardiovascular: normal rate Respiratory: normal effort GI: Abd soft, non-tender, gravid appropriate for gestational age. Pos BS x 4 MS: Extremities nontender, no edema, normal ROM Neurologic: Alert and oriented x 4.  GU: Neg CVAT.  Pelvic: NEFG, physiologic discharge, no blood, cervix clean. Pelvic adequate for labor. No CMT  Dilation: 1(2cm external OS) Effacement (%): Thick Cervical Position: Posterior Station: Ballotable Presentation: Vertex Exam by:: Leafy Ro, RNC  NST: FHR baseline 140 bpm, Variability: moderate, Accelerations:present,  Decelerations:  Absent= Cat 1/Reactive UC:   irregular, every 1.4-5 minutes, maternal perception mild SVE:   Dilation: 1(2cm external OS) Effacement (%): Thick Station: Ballotable Exam by:: Leafy Ro, RNC, vertex verified by bedside US.  Leopold's: Position vertex, EFW 4.5-5lbs via leopold's.   Pt given procardia 20mg  IR, cxt simmered and uterus just appears irritable.  No SVE change post 1 hour No SVe post another hour later.   Labs: Results for orders placed or performed during the hospital encounter of 05/03/18 (from the past 24 hour(s))  Urinalysis, Routine w reflex microscopic     Status: Abnormal   Collection Time: 05/03/18  3:04 PM  Result Value Ref Range   Color, Urine YELLOW YELLOW   APPearance CLEAR CLEAR   Specific Gravity, Urine 1.016 1.005 - 1.030   pH 7.0 5.0 - 8.0   Glucose, UA 150 (A) NEGATIVE mg/dL   Hgb urine dipstick NEGATIVE NEGATIVE   Bilirubin Urine NEGATIVE NEGATIVE   Ketones, ur 5 (A) NEGATIVE mg/dL   Protein, ur NEGATIVE NEGATIVE mg/dL   Nitrite NEGATIVE NEGATIVE   Leukocytes,Ua NEGATIVE NEGATIVE    Imaging:  No results found.  MAU Course: Orders Placed This Encounter  Procedures  . Culture, Urine  . Urinalysis, Routine w reflex microscopic  . Diet - low sodium heart healthy  . Increase activity slowly  . Call MD for:  . Call MD for:  temperature >100.4  . Call MD for:  persistant nausea and vomiting  . Call MD for:  severe uncontrolled pain  . Call MD for:  redness, tenderness, or signs of infection (pain, swelling, redness, odor or green/yellow discharge around incision site)  . Call MD for:  difficulty breathing, headache or visual disturbances  . Call MD for:  hives  . Call MD for:  persistant dizziness or light-headedness  . Call MD for:  extreme fatigue  . (HEART FAILURE PATIENTS) Call MD:  Anytime you have any of the following symptoms: 1) 3 pound weight gain in 24 hours or 5 pounds in 1 week 2) shortness of breath, with or without a  dry hacking cough 3) swelling in the hands, feet or stomach 4) if you have to sleep on extra pillows at night in order to breathe.  . Discharge patient Discharge disposition: 01-Home or Self Care; Discharge patient date: 05/03/2018   Meds ordered this encounter  Medications  . DISCONTD: NIFEdipine (PROCARDIA) capsule 20 mg    MDM: PE, VS, Labs, NST, consulted with Dr Sallye Ober, Pt discharge home.   Assessment: 1. Preterm contractions   2. Pelvic pressure in pregnancy   Patricia  ZAYLAH Macdonald is a 33 y.o. [id number] at [redacted]w[redacted]d who presents to maternity admissions reporting that she woke with a new waddle in her walk and had increased pelvic pressure, pt was concerned about this. Pt denies dysuria, no vaginal discharge, denies taking anything to make it better or worse. Denies contractions, leakage of fluid or vaginal bleeding. Good fetal movement. Pt stable. Procardia 20 mg IR given for preterm cxt x2 doses which  cxt subsided. My impression is this is her pelvix relaxing due to hormone relaxin and giving her the pregnancy waddle. No PTL suspected due to no cervical change.   Consulted with Dr Sallye Ober, whom verbalized POC and understand and agrees with plan, reviewed if pt should receive BMZ, agreed, pt not in labor and no cervical change over last three hours, therefore no BMZ indicated at this time.   Plan: Discharge home in stable condition.  Labor precautions and fetal kick counts Follow-up Information    [facility] Follow up in 1 day(s).   Specialty:  Obstetrics and Gynecology Why:  Next scheduled ROB visit  Contact information: [address] [place] [address] [phone]        Hydrate to decrease cxt UC pending.   Trochanter belt for waddle walk, move slowly when getting up or out of the car.  Allergies as of 05/03/2018      Reactions   Penicillins Anaphylaxis   Has patient had a PCN reaction causing immediate rash,  facial/tongue/throat swelling, SOB or lightheadedness with hypotension:  yes Has patient had a PCN reaction causing severe rash involving mucus membranes or skin necrosis:  no Has patient had a PCN reaction that required hospitalization: yes Has patient had a PCN reaction occurring within the last 10 years:no If all of the above answers are "NO", then may proceed with Cephalosporin use.   Latex Hives, Swelling   Percocet [oxycodone-acetaminophen] Nausea Only   Patient can tolerate acetaminophen solely   Reglan [metoclopramide] Other (See Comments)   Akathisia    Clindamycin/lincomycin Other (See Comments)   Burning      Medication List    TAKE these medications   prenatal multivitamin Tabs tablet Take 1 tablet by mouth daily at 12 noon.       [facility] NP-C, CNM [place], [place] 05/03/2018 5:54 PM

## 2018-05-04 LAB — URINE CULTURE: CULTURE: NO GROWTH

## 2018-05-06 DIAGNOSIS — O403XX0 Polyhydramnios, third trimester, not applicable or unspecified: Secondary | ICD-10-CM | POA: Diagnosis not present

## 2018-05-06 DIAGNOSIS — Z3A34 34 weeks gestation of pregnancy: Secondary | ICD-10-CM | POA: Diagnosis not present

## 2018-05-13 DIAGNOSIS — O403XX1 Polyhydramnios, third trimester, fetus 1: Secondary | ICD-10-CM | POA: Diagnosis not present

## 2018-05-13 DIAGNOSIS — Z3A35 35 weeks gestation of pregnancy: Secondary | ICD-10-CM | POA: Diagnosis not present

## 2018-05-13 LAB — OB RESULTS CONSOLE GBS: GBS: NEGATIVE

## 2018-05-20 DIAGNOSIS — Z3A36 36 weeks gestation of pregnancy: Secondary | ICD-10-CM | POA: Diagnosis not present

## 2018-05-20 DIAGNOSIS — O99213 Obesity complicating pregnancy, third trimester: Secondary | ICD-10-CM | POA: Diagnosis not present

## 2018-05-27 DIAGNOSIS — O99213 Obesity complicating pregnancy, third trimester: Secondary | ICD-10-CM | POA: Diagnosis not present

## 2018-05-27 DIAGNOSIS — O403XX1 Polyhydramnios, third trimester, fetus 1: Secondary | ICD-10-CM | POA: Diagnosis not present

## 2018-05-27 DIAGNOSIS — Z3A37 37 weeks gestation of pregnancy: Secondary | ICD-10-CM | POA: Diagnosis not present

## 2018-06-08 ENCOUNTER — Encounter (HOSPITAL_COMMUNITY): Payer: Self-pay | Admitting: *Deleted

## 2018-06-08 ENCOUNTER — Telehealth (HOSPITAL_COMMUNITY): Payer: Self-pay | Admitting: *Deleted

## 2018-06-08 NOTE — Telephone Encounter (Signed)
Preadmission screen  

## 2018-06-10 ENCOUNTER — Inpatient Hospital Stay (HOSPITAL_COMMUNITY)
Admission: AD | Admit: 2018-06-10 | Discharge: 2018-06-10 | Disposition: A | Discharge status: Home / Self Care | Payer: 59 | Attending: Obstetrics & Gynecology | Admitting: Obstetrics & Gynecology

## 2018-06-10 ENCOUNTER — Encounter (HOSPITAL_COMMUNITY): Payer: Self-pay

## 2018-06-10 ENCOUNTER — Other Ambulatory Visit: Payer: Self-pay

## 2018-06-10 DIAGNOSIS — O471 False labor at or after 37 completed weeks of gestation: Secondary | ICD-10-CM | POA: Diagnosis present

## 2018-06-10 DIAGNOSIS — Z3689 Encounter for other specified antenatal screening: Secondary | ICD-10-CM

## 2018-06-10 DIAGNOSIS — Z3A39 39 weeks gestation of pregnancy: Secondary | ICD-10-CM | POA: Insufficient documentation

## 2018-06-10 DIAGNOSIS — O479 False labor, unspecified: Secondary | ICD-10-CM | POA: Diagnosis not present

## 2018-06-10 NOTE — MAU Note (Signed)
Pt here with c/o contractions. Denies any bleeding. Reports good fetal movement.

## 2018-06-10 NOTE — MAU Note (Signed)
Communicated cervical exams, fhr reactive, and ctx pattern reviewed, vitals stable, Marie CNM reviewed fhm strip.  Received d/c order.  Labor instructions given to patient and she voiced understanding.

## 2018-06-10 NOTE — MAU Provider Note (Signed)
S: Ms. JASMAN VEITH is a 33 y.o. [id number] at [redacted]w[redacted]d  who presents to MAU today for labor evaluation.     Cervical exam by RN:  Dilation: 3.5 Effacement (%): 60 Cervical Position: Posterior Station: -2, -3 Presentation: Vertex Exam by:: Remigio Eisenmenger RN  Fetal Monitoring: Baseline: 140 Variability: average Accelerations: present Decelerations: absent Contractions: irregular  Dilation: 3.5 Effacement (%): 60 Cervical Position: Posterior Station: -2, -3 Presentation: Vertex Exam by:: Remigio Eisenmenger RN   MDM Discussed patient with RN. NST reviewed.  We gave her an extra hour for labor check and still no change, pt found sleeping  A: SIUP at [redacted]w[redacted]d  False labor  P: Discharge home Labor precautions and kick counts included in AVS Patient to follow-up with [facility] as scheduled  Patient may return to MAU as needed or when in labor   Valora Piccolo 06/10/2018 9:31 PM

## 2018-06-10 NOTE — Discharge Instructions (Signed)

## 2018-06-15 DIAGNOSIS — Z3493 Encounter for supervision of normal pregnancy, unspecified, third trimester: Secondary | ICD-10-CM | POA: Diagnosis not present

## 2018-06-15 DIAGNOSIS — Z3A40 40 weeks gestation of pregnancy: Secondary | ICD-10-CM | POA: Diagnosis not present

## 2018-06-17 ENCOUNTER — Other Ambulatory Visit: Payer: Self-pay | Admitting: Obstetrics & Gynecology

## 2018-06-20 ENCOUNTER — Encounter (HOSPITAL_COMMUNITY): Payer: Self-pay

## 2018-06-20 ENCOUNTER — Other Ambulatory Visit: Payer: Self-pay

## 2018-06-20 ENCOUNTER — Inpatient Hospital Stay (HOSPITAL_COMMUNITY)
Admission: AD | Admit: 2018-06-20 | Discharge: 2018-06-22 | DRG: 807 | Disposition: A | Discharge status: Home / Self Care | Payer: 59 | Attending: Obstetrics & Gynecology | Admitting: Obstetrics & Gynecology

## 2018-06-20 ENCOUNTER — Inpatient Hospital Stay (HOSPITAL_COMMUNITY): Payer: 59

## 2018-06-20 DIAGNOSIS — Z3A41 41 weeks gestation of pregnancy: Secondary | ICD-10-CM

## 2018-06-20 DIAGNOSIS — O9081 Anemia of the puerperium: Secondary | ICD-10-CM | POA: Diagnosis not present

## 2018-06-20 DIAGNOSIS — Z880 Allergy status to penicillin: Secondary | ICD-10-CM

## 2018-06-20 DIAGNOSIS — O480 Post-term pregnancy: Principal | ICD-10-CM | POA: Diagnosis present

## 2018-06-20 LAB — CBC
HCT: 34.3 % — ABNORMAL LOW (ref 36.0–46.0)
Hemoglobin: 10 g/dL — ABNORMAL LOW (ref 12.0–15.0)
MCH: 22.2 pg — ABNORMAL LOW (ref 26.0–34.0)
MCHC: 29.2 g/dL — ABNORMAL LOW (ref 30.0–36.0)
MCV: 76.1 fL — ABNORMAL LOW (ref 80.0–100.0)
Platelets: 221 10*3/uL (ref 150–400)
RBC: 4.51 MIL/uL (ref 3.87–5.11)
RDW: 19.8 % — ABNORMAL HIGH (ref 11.5–15.5)
WBC: 7.9 10*3/uL (ref 4.0–10.5)
nRBC: 0.3 % — ABNORMAL HIGH (ref 0.0–0.2)

## 2018-06-20 LAB — TYPE AND SCREEN
ABO/RH(D): O POS
Antibody Screen: NEGATIVE

## 2018-06-20 LAB — RPR: RPR Ser Ql: NONREACTIVE

## 2018-06-20 MED ORDER — ZOLPIDEM TARTRATE 5 MG PO TABS: 5.0000 mg | ORAL_TABLET | Freq: Every evening | ORAL | Status: DC | PRN

## 2018-06-20 MED ORDER — IBUPROFEN 600 MG PO TABS
600.0000 mg | ORAL_TABLET | Freq: Four times a day (QID) | ORAL | Status: DC
  Administered 2018-06-20 – 2018-06-22 (×9): 600 mg via ORAL
  Filled 2018-06-20 (×9): qty 1

## 2018-06-20 MED ORDER — SOD CITRATE-CITRIC ACID 500-334 MG/5ML PO SOLN: 30.0000 mL | ORAL | Status: DC | PRN

## 2018-06-20 MED ORDER — PRENATAL MULTIVITAMIN CH
1.0000 | ORAL_TABLET | Freq: Every day | ORAL | Status: DC
  Administered 2018-06-20 – 2018-06-22 (×3): 1 via ORAL
  Filled 2018-06-20 (×3): qty 1

## 2018-06-20 MED ORDER — DIPHENHYDRAMINE HCL 25 MG PO CAPS: 25.0000 mg | ORAL_CAPSULE | Freq: Four times a day (QID) | ORAL | Status: DC | PRN

## 2018-06-20 MED ORDER — COCONUT OIL OIL: 1.0000 "application " | TOPICAL_OIL | Status: DC | PRN

## 2018-06-20 MED ORDER — TETANUS-DIPHTH-ACELL PERTUSSIS 5-2.5-18.5 LF-MCG/0.5 IM SUSP: 0.5000 mL | Freq: Once | INTRAMUSCULAR | Status: DC

## 2018-06-20 MED ORDER — BENZOCAINE-MENTHOL 20-0.5 % EX AERO
1.0000 "application " | INHALATION_SPRAY | CUTANEOUS | Status: DC | PRN
  Administered 2018-06-20: 1 via TOPICAL
  Filled 2018-06-20: qty 56

## 2018-06-20 MED ORDER — ONDANSETRON HCL 4 MG/2ML IJ SOLN: 4.0000 mg | Freq: Four times a day (QID) | INTRAMUSCULAR | Status: DC | PRN

## 2018-06-20 MED ORDER — TERBUTALINE SULFATE 1 MG/ML IJ SOLN: 0.2500 mg | Freq: Once | INTRAMUSCULAR | Status: DC | PRN

## 2018-06-20 MED ORDER — ONDANSETRON HCL 4 MG/2ML IJ SOLN: 4.0000 mg | INTRAMUSCULAR | Status: DC | PRN

## 2018-06-20 MED ORDER — LIDOCAINE HCL (PF) 1 % IJ SOLN: 30.0000 mL | INTRAMUSCULAR | Status: DC | PRN

## 2018-06-20 MED ORDER — OXYTOCIN BOLUS FROM INFUSION: 500.0000 mL | Freq: Once | INTRAVENOUS | Status: DC

## 2018-06-20 MED ORDER — OXYTOCIN 40 UNITS IN NORMAL SALINE INFUSION - SIMPLE MED
1.0000 m[IU]/min | INTRAVENOUS | Status: DC
  Administered 2018-06-20: 1 m[IU]/min via INTRAVENOUS
  Filled 2018-06-20: qty 1000

## 2018-06-20 MED ORDER — WITCH HAZEL-GLYCERIN EX PADS: 1.0000 "application " | MEDICATED_PAD | CUTANEOUS | Status: DC | PRN

## 2018-06-20 MED ORDER — SIMETHICONE 80 MG PO CHEW: 80.0000 mg | CHEWABLE_TABLET | ORAL | Status: DC | PRN

## 2018-06-20 MED ORDER — FENTANYL CITRATE (PF) 100 MCG/2ML IJ SOLN
50.0000 ug | INTRAMUSCULAR | Status: DC | PRN
  Administered 2018-06-20: 100 ug via INTRAVENOUS
  Filled 2018-06-20: qty 2

## 2018-06-20 MED ORDER — ACETAMINOPHEN 80 MG PO CHEW: 650.0000 mg | CHEWABLE_TABLET | ORAL | Status: DC | PRN

## 2018-06-20 MED ORDER — OXYTOCIN 40 UNITS IN NORMAL SALINE INFUSION - SIMPLE MED: 2.5000 [IU]/h | INTRAVENOUS | Status: DC

## 2018-06-20 MED ORDER — ACETAMINOPHEN 325 MG PO TABS
650.0000 mg | ORAL_TABLET | ORAL | Status: DC | PRN
  Administered 2018-06-20 – 2018-06-21 (×4): 650 mg via ORAL
  Filled 2018-06-20 (×4): qty 2

## 2018-06-20 MED ORDER — SENNOSIDES-DOCUSATE SODIUM 8.6-50 MG PO TABS
2.0000 | ORAL_TABLET | ORAL | Status: DC
  Administered 2018-06-21 – 2018-06-22 (×2): 2 via ORAL
  Filled 2018-06-20 (×2): qty 2

## 2018-06-20 MED ORDER — IBUPROFEN 800 MG PO TABS
800.0000 mg | ORAL_TABLET | Freq: Once | ORAL | Status: AC
  Administered 2018-06-20: 07:00:00 800 mg via ORAL
  Filled 2018-06-20: qty 1

## 2018-06-20 MED ORDER — LACTATED RINGERS IV SOLN: 500.0000 mL | INTRAVENOUS | Status: DC | PRN

## 2018-06-20 MED ORDER — ONDANSETRON HCL 4 MG PO TABS: 4.0000 mg | ORAL_TABLET | ORAL | Status: DC | PRN

## 2018-06-20 MED ORDER — LACTATED RINGERS IV SOLN
INTRAVENOUS | Status: DC
  Administered 2018-06-20: via INTRAVENOUS

## 2018-06-20 MED ORDER — DIBUCAINE (PERIANAL) 1 % EX OINT: 1.0000 "application " | TOPICAL_OINTMENT | CUTANEOUS | Status: DC | PRN

## 2018-06-20 MED ORDER — OXYCODONE HCL 5 MG PO TABS
5.0000 mg | ORAL_TABLET | ORAL | Status: DC | PRN
  Administered 2018-06-20 – 2018-06-21 (×5): 5 mg via ORAL
  Filled 2018-06-20 (×5): qty 1

## 2018-06-20 MED ORDER — MISOPROSTOL 25 MCG QUARTER TABLET: 25.0000 ug | ORAL_TABLET | ORAL | Status: DC | PRN

## 2018-06-20 MED ORDER — FLEET ENEMA 7-19 GM/118ML RE ENEM: 1.0000 | ENEMA | RECTAL | Status: DC | PRN

## 2018-06-20 MED ORDER — OXYCODONE HCL 5 MG PO TABS
10.0000 mg | ORAL_TABLET | Freq: Once | ORAL | Status: AC
  Administered 2018-06-20: 10 mg via ORAL
  Filled 2018-06-20: qty 2

## 2018-06-20 NOTE — H&P (Signed)
Patricia Macdonald is a 33 y.o. female, [id number] at 67 weeks, presenting for induction of labor for postdate.  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 Prenatal hx of grandmultiparity, and anemia treated and umbilical hernia  Patient Active Problem List   Diagnosis Date Noted  . Post-dates pregnancy 06/20/2018  . Folliculitis 11/20/2010    History of present pregnancy: Patient entered care at 11 weeks. Than returned at 19 weeks  EDC of 06/13/18 was established by LMP.   Anatomy scan: 27 weeks, with normal findings and an posterior placenta.   Additional [place] evaluations:  None Significant prenatal events:  Repeat found to have polyhydramniso at 32 weeks 25.5 resolved at 38 weeks Last evaluation:  Last week  OB History    Gravida  6   Para  4   Term  4   Preterm  0   AB  1   Living  4     SAB  1   TAB      Ectopic      Multiple      Live Births  1          Past Medical History:  Diagnosis Date  . Abnormal Pap smear of cervix   . Anemia   . Anxiety   . Blood transfusion 2005  . Breast mass 04/17/2016   left breast lump x 400 x 3 weeks with pain  . IBS (irritable bowel syndrome)   . Postpartum hemorrhage 09/2003  . Umbilical hernia    Past Surgical History:  Procedure Laterality Date  . COLONOSCOPY    . COLPOSCOPY     Family History: family history includes Diabetes in her mother. Social History:  reports that she has never smoked. She has never used smokeless tobacco. She reports that she does not drink alcohol or use drugs.   Prenatal Transfer Tool  Maternal Diabetes: No Genetic Screening: Declined Maternal Ultrasounds/Referrals: Normal Fetal Ultrasounds or other Referrals:  None Maternal Substance Abuse:  No Significant Maternal Medications:  None Significant Maternal Lab Results: None  TDAP done Flu declined  ROS: All ten systems reviewed and negative except as stated above  Allergies  Allergen Reactions  .  Penicillins Anaphylaxis    Has patient had a PCN reaction causing immediate rash, facial/tongue/throat swelling, SOB or lightheadedness with hypotension:  yes Has patient had a PCN reaction causing severe rash involving mucus membranes or skin necrosis:  no Has patient had a PCN reaction that required hospitalization: yes Has patient had a PCN reaction occurring within the last 10 years:no If all of the above answers are "NO", then may proceed with Cephalosporin use.   . Latex Hives and Swelling  . Percocet [Oxycodone-Acetaminophen] Nausea Only    Patient can tolerate acetaminophen solely  . Reglan [Metoclopramide] Other (See Comments)    Akathisia   . Clindamycin/Lincomycin Other (See Comments)    Burning      Dilation: 4.5 Effacement (%): 80 Station: -3 Exam by:: Jonelle Sidle, RN Blood pressure (!) 145/86, pulse 76, temperature 98.6 F (37 C), temperature source Oral, resp. rate 17, last menstrual period 08/06/2017, unknown if currently breastfeeding.  Chest clear Heart RRR without murmur Abd gravid, NT, FH appropriate Pelvic: per RN Ext: Neg +2  FHR: Category 1FHT 135 accels, no decels variability present UCs:  irregular  Prenatal labs: ABO, Rh: O/Positive/-- (12/18 0000) Antibody: Negative (12/18 0000) Rubella:  Immune (12/18 0000) RPR: Nonreactive (12/18 0000)  HBsAg: Negative (12/18 0000)  HIV: Non-reactive (12/18 0000)  GBS: Negative (03/12 0000) Sickle cell/Hgb electrophoresis: AA GC: Negative Chlamydia: Negative Genetic screenings: Declined Glucola: 176 Other:  Three hour wnl Hgb10.1 at NOB,  at 28 weeks       Assessment/Plan: IUP at 41 week IUP induction for postdates Cat 1 strip  Plan: Admit to Birthing Suite  Routine [facility] orders Pain med/epidural prn Pitocin for induction  Henderson NewcomerNancy Jean ProtheroCNM, MSN 06/20/2018, 1:06 AM

## 2018-06-20 NOTE — Progress Notes (Signed)
Subjective: Comfortable and asked to have water broken  Objective: BP (!) 145/86   Pulse 76   Temp 98.6 F (37 C) (Oral)   Resp 17   LMP 08/06/2017  No intake/output data recorded. No intake/output data recorded.  FHT: Category 1FHT 135 accels, no decels UC:   irregular, every 2-5 minutes SVE:   Dilation: 5 Effacement (%): 80 Station: -3 Exam by:: Bernerd Pho, CNM Pitocin at started   Assessment:  [id number] at 41 weeks induction postdates Cat 1 strip  Plan: Anticipate SVD  Kenney Houseman CNM, MSN 06/20/2018, 1:37 AM

## 2018-06-20 NOTE — Progress Notes (Signed)
MOB was referred for history of anxiety. * Referral screened out by Clinical Social Worker because none of the following criteria appear to apply: ~ History of anxiety/depression during this pregnancy, or of post-partum depression following prior delivery. No concerns of anxiety nor depression noted in MOB's OB records.  ~ Diagnosis of anxiety and/or depression within last 3 years. Per MOB's Care Everywhere records review, MOB's anxiety dates back to 54. Per Care Everywhere, MOB also has a history of depression that dates back to 2016.  OR * MOB's symptoms currently being treated with medication and/or therapy. Please contact the Clinical Social Worker if needs arise, by [facility] request, or if MOB scores greater than 9/yes to question 10 on Edinburgh Postpartum Depression Screen.  Celso Sickle, LCSW Clinical Social Worker [facility] Cell#: [phone]

## 2018-06-20 NOTE — Lactation Note (Addendum)
This note was copied from a baby's chart. Lactation Consultation Note  Patient Name: Patricia Macdonald [id number] Date: 06/20/2018 Reason for consult: Initial assessment;Term  53 hours old FT female who is being partially BF and formula fed by his mother, that was her feeding choice on admission. Mom is a P5 and experienced BF. She was able to BF her other kids for 10 months, she's also familiar with hand expression. When LC revised hand expression with mom, she was able to get two small droplets of colostrum, mom was concerns about not having "any milk" because she said that she used to be leaking with her other kids, however with this baby is different "no milk" yet. Explained to mom that she can always offer some extra drops of colostrum when she decides to hand express. Mom has a Medela DEBP coming in the mail.  Baby was asleep when entering the room, he had a pacifier laying on his bassinet. Discussed with mom how pacifiers early on can hide feeding cues. Baby is on Gerber gentle, parents using an extra slow flow nipple, but mom told LC that baby spit up some formula after he had 10 ml; he was also gassy. Discussed the small size of baby's stomach in comparison to volumes typically given from bottle during supplementation. Reviewed lactogenesis II, feeding cues and normal newborn behavior. Mom is aware that newborns only need drops of colostrum the first 24 hours, but constantly, at least 8-12 times/24 hours. Mom voiced understanding.  Feeding plan:  1. Encouraged mom to feed baby STS 8-12 times/24 hours or sooner if feeding cues are present 2. Hand expression and spoon feeding were also discussed 3. If mom wishes to continue supplementing, she'll follow formula supplementation guidelines volumes according to baby's age in hours  BF brochure, BF resources and feeding diary were reviewed. Mom reported all questions and concerns were answered, she's aware of LC OP services and will call  PRN.  Maternal Data Formula Feeding for Exclusion: Yes Reason for exclusion: Mother's choice to formula and breast feed on admission Has patient been taught Hand Expression?: Yes Does the patient have breastfeeding experience prior to this delivery?: Yes  Feeding Feeding Type: Breast Fed  LATCH Score Latch: Grasps breast easily, tongue down, lips flanged, rhythmical sucking.  Audible Swallowing: A few with stimulation  Type of Nipple: Everted at rest and after stimulation  Comfort (Breast/Nipple): Soft / non-tender  Hold (Positioning): No assistance needed to correctly position infant at breast.  LATCH Score: 9  Interventions Interventions: Breast feeding basics reviewed;Breast compression;Hand express;Breast massage  Lactation Tools Discussed/Used WIC Program: Yes   Consult Status Consult Status: PRN Follow-up type: In-patient    Harry Shuck Venetia Constable 06/20/2018, 4:17 PM

## 2018-06-21 LAB — CBC
HCT: 29.6 % — ABNORMAL LOW (ref 36.0–46.0)
Hemoglobin: 8.5 g/dL — ABNORMAL LOW (ref 12.0–15.0)
MCH: 22.1 pg — ABNORMAL LOW (ref 26.0–34.0)
MCHC: 28.7 g/dL — ABNORMAL LOW (ref 30.0–36.0)
MCV: 76.9 fL — ABNORMAL LOW (ref 80.0–100.0)
Platelets: 188 10*3/uL (ref 150–400)
RBC: 3.85 MIL/uL — ABNORMAL LOW (ref 3.87–5.11)
RDW: 19.6 % — ABNORMAL HIGH (ref 11.5–15.5)
WBC: 8.5 10*3/uL (ref 4.0–10.5)
nRBC: 0 % (ref 0.0–0.2)

## 2018-06-21 LAB — ABO/RH: ABO/RH(D): O POS

## 2018-06-21 MED ORDER — MEDROXYPROGESTERONE ACETATE 150 MG/ML IM SUSP
150.0000 mg | Freq: Once | INTRAMUSCULAR | Status: AC
  Administered 2018-06-22: 150 mg via INTRAMUSCULAR
  Filled 2018-06-21: qty 1

## 2018-06-21 MED ORDER — FERROUS SULFATE 325 (65 FE) MG PO TABS
325.0000 mg | ORAL_TABLET | Freq: Two times a day (BID) | ORAL | Status: DC
  Administered 2018-06-21 – 2018-06-22 (×3): 325 mg via ORAL
  Filled 2018-06-21 (×3): qty 1

## 2018-06-21 NOTE — Discharge Summary (Signed)
OB Discharge Summary     Patient Name: Patricia Macdonald DOB: 1985/07/22 MRN: [id number]  Date of admission: 06/20/2018 Delivering MD: Kenney Houseman   Date of discharge: 06/22/2018  Admitting diagnosis: PREG Intrauterine pregnancy: [redacted]w[redacted]d     Secondary diagnosis:  Active Problems:   Post-dates pregnancy   SVD (spontaneous vaginal delivery)   Postpartum anemia   Normal postpartum course    Discharge diagnosis: Term Pregnancy Delivered and Anemia                                                                                                Post partum procedures:n/a  Augmentation: AROM and Pitocin  Complications: None  Hospital course:  Induction of Labor With Vaginal Delivery   33 y.o. yo [id number] at [redacted]w[redacted]d was admitted to the hospital 06/20/2018 for induction of labor.  Indication for induction: Postdates.  Patient had an uncomplicated labor course as follows: Membrane Rupture Time/Date: 1:30 AM ,06/20/2018   Intrapartum Procedures: Episiotomy: None [1]                                         Lacerations:  None [1]  Patient had delivery of a Viable infant.  Information for the patient's newborn:  Janilyn, Gallon [[id number]]  Delivery Method: Vag-Spont   06/20/2018  Details of delivery can be found in separate delivery note.  Patient had a routine postpartum course. Patient is discharged home 06/22/18.  Physical exam  Vitals:   06/21/18 0600 06/21/18 1815 06/21/18 2111 06/22/18 0615  BP: 122/79 124/78 121/86 122/84  Pulse: 73 88  68  Resp: 16 18 18 18   Temp: 98.4 F (36.9 C) 98.4 F (36.9 C) 99.6 F (37.6 C) 98 F (36.7 C)  TempSrc: Oral  Oral Oral  SpO2: 99% 100%  100%   General: alert, cooperative and no distress Lochia: appropriate Uterine Fundus: firm Incision: N/A DVT Evaluation: No evidence of DVT seen on physical exam. Negative Homan's sign. No cords or calf tenderness. No significant calf/ankle edema.  Labs: Lab Results  Component Value Date    WBC 7.2 06/22/2018   HGB 8.4 (L) 06/22/2018   HCT 29.1 (L) 06/22/2018   MCV 77.2 (L) 06/22/2018   PLT 195 06/22/2018   CMP Latest Ref Rng & Units 12/04/2017  Glucose 70 - 99 mg/dL 94  BUN 6 - 20 mg/dL 7  Creatinine 1.41 - 0.30 mg/dL 1.31  Sodium 438 - 887 mmol/L 134(L)  Potassium 3.5 - 5.1 mmol/L 3.8  Chloride 98 - 111 mmol/L 104  CO2 22 - 32 mmol/L 21(L)  Calcium 8.9 - 10.3 mg/dL 8.9  Total Protein 6.5 - 8.1 g/dL 7.6  Total Bilirubin 0.3 - 1.2 mg/dL 0.4  Alkaline Phos 38 - 126 U/L 73  AST 15 - 41 U/L 16  ALT 0 - 44 U/L 14    Discharge instruction: per After Visit Summary and "Baby and Me Booklet".  After visit meds:  Allergies as of 06/22/2018  Reactions   Penicillins Anaphylaxis   Has patient had a PCN reaction causing immediate rash, facial/tongue/throat swelling, SOB or lightheadedness with hypotension:  yes Has patient had a PCN reaction causing severe rash involving mucus membranes or skin necrosis:  no Has patient had a PCN reaction that required hospitalization: yes Has patient had a PCN reaction occurring within the last 10 years:no If all of the above answers are "NO", then may proceed with Cephalosporin use.   Latex Hives, Swelling   Percocet [oxycodone-acetaminophen] Nausea Only   Patient can tolerate acetaminophen solely   Reglan [metoclopramide] Other (See Comments)   Akathisia    Clindamycin/lincomycin Other (See Comments)   Burning      Medication List    TAKE these medications   acetaminophen 500 MG tablet Commonly known as:  TYLENOL Take 1,000 mg by mouth every 6 (six) hours as needed for mild pain.   ferrous sulfate 325 (65 FE) MG tablet Take 325 mg by mouth daily with breakfast.   ibuprofen 600 MG tablet Commonly known as:  ADVIL Take 1 tablet (600 mg total) by mouth every 6 (six) hours as needed for moderate pain.   prenatal multivitamin Tabs tablet Take 1 tablet by mouth daily at 12 noon.       Diet: routine diet  Activity:  Advance as tolerated. Pelvic rest for 6 weeks.   Outpatient follow up:6 weeks Follow up Appt:No future appointments. Follow up Visit:No follow-ups on file.  Postpartum contraception: Depo Provera  Newborn Data: Live born female  Birth Weight: 8 lb 6.6 oz (3816 g) APGAR: 9, 10  Newborn Delivery   Birth date/time:  06/20/2018 06:26:00 Delivery type:  Vaginal, Spontaneous     Baby Feeding: Breast Disposition:home with mother   06/22/2018 Janeece RiggersEllis K Gautham Hewins, CNM

## 2018-06-21 NOTE — Discharge Instructions (Signed)
Postpartum Care After Vaginal Delivery ° °The period of time right after you deliver your newborn is called the postpartum period. °What kind of medical care will I receive? °· You may continue to receive fluids and medicines through an IV tube inserted into one of your veins. °· If an incision was made near your vagina (episiotomy) or if you had some vaginal tearing during delivery, cold compresses may be placed on your episiotomy or your tear. This helps to reduce pain and swelling. °· You may be given a squirt bottle to use when you go to the bathroom. You may use this until you are comfortable wiping as usual. To use the squirt bottle, follow these steps: °? Before you urinate, fill the squirt bottle with warm water. Do not use hot water. °? After you urinate, while you are sitting on the toilet, use the squirt bottle to rinse the area around your urethra and vaginal opening. This rinses away any urine and blood. °? You may do this instead of wiping. As you start healing, you may use the squirt bottle before wiping yourself. Make sure to wipe gently. °? Fill the squirt bottle with clean water every time you use the bathroom. °· You will be given sanitary pads to wear. °How can I expect to feel? °· You may not feel the need to urinate for several hours after delivery. °· You will have some soreness and pain in your abdomen and vagina. °· If you are breastfeeding, you may have uterine contractions every time you breastfeed for up to several weeks postpartum. Uterine contractions help your uterus return to its normal size. °· It is normal to have vaginal bleeding (lochia) after delivery. The amount and appearance of lochia is often similar to a menstrual period in the first week after delivery. It will gradually decrease over the next few weeks to a dry, yellow-brown discharge. For most women, lochia stops completely by 6-8 weeks after delivery. Vaginal bleeding can vary from woman to woman. °· Within the first few  days after delivery, you may have breast engorgement. This is when your breasts feel heavy, full, and uncomfortable. Your breasts may also throb and feel hard, tightly stretched, warm, and tender. After this occurs, you may have milk leaking from your breasts. Your health care provider can help you relieve discomfort due to breast engorgement. Breast engorgement should go away within a few days. °· You may feel more sad or worried than normal due to hormonal changes after delivery. These feelings should not last more than a few days. If these feelings do not go away after several days, speak with your health care provider. °How should I care for myself? °· Tell your health care provider if you have pain or discomfort. °· Drink enough water to keep your urine clear or pale yellow. °· Wash your hands thoroughly with soap and water for at least 20 seconds after changing your sanitary pads, after using the toilet, and before holding or feeding your baby. °· If you are not breastfeeding, avoid touching your breasts a lot. Doing this can make your breasts produce more milk. °· If you become weak or lightheaded, or you feel like you might faint, ask for help before: °? Getting out of bed. °? Showering. °· Change your sanitary pads frequently. Watch for any changes in your flow, such as a sudden increase in volume, a change in color, the passing of large blood clots. If you pass a blood clot from your vagina,   save it to show to your health care provider. Do not flush blood clots down the toilet without having your health care provider look at them. °· Make sure that all your vaccinations are up to date. This can help protect you and your baby from getting certain diseases. You may need to have immunizations done before you leave the hospital. °· If desired, talk with your health care provider about methods of family planning or birth control (contraception). °How can I start bonding with my baby? °Spending as much time as  possible with your baby is very important. During this time, you and your baby can get to know each other and develop a bond. Having your baby stay with you in your room (rooming in) can give you time to get to know your baby. Rooming in can also help you become comfortable caring for your baby. Breastfeeding can also help you bond with your baby. °How can I plan for returning home with my baby? °· Make sure that you have a car seat installed in your vehicle. °? Your car seat should be checked by a certified car seat installer to make sure that it is installed safely. °? Make sure that your baby fits into the car seat safely. °· Ask your health care provider any questions you have about caring for yourself or your baby. Make sure that you are able to contact your health care provider with any questions after leaving the hospital. °This information is not intended to replace advice given to you by your health care provider. Make sure you discuss any questions you have with your health care provider. °Document Released: 12/15/2006 Document Revised: 07/23/2015 Document Reviewed: 01/22/2015 °Elsevier Interactive Patient Education © 2018 [employer] ° ° °Postpartum Depression and Baby Blues °The postpartum period begins right after the birth of a baby. During this time, there is often a great amount of joy and excitement. It is also a time of many changes in the life of the parents. Regardless of how many times a mother gives birth, each child brings new challenges and dynamics to the family. It is not unusual to have feelings of excitement along with confusing shifts in moods, emotions, and thoughts. All mothers are at risk of developing postpartum depression or the "baby blues." These mood changes can occur right after giving birth, or they may occur many months after giving birth. The baby blues or postpartum depression can be mild or severe. Additionally, postpartum depression can go away rather quickly, or it can  be a long-term condition. °What are the causes? °Raised hormone levels and the rapid drop in those levels are thought to be a main cause of postpartum depression and the baby blues. A number of hormones change during and after pregnancy. Estrogen and progesterone usually decrease right after the delivery of your baby. The levels of thyroid hormone and various cortisol steroids also rapidly drop. Other factors that play a role in these mood changes include major life events and genetics. °What increases the risk? °If you have any of the following risks for the baby blues or postpartum depression, know what symptoms to watch out for during the postpartum period. Risk factors that may increase the likelihood of getting the baby blues or postpartum depression include: °· Having a personal or family history of depression. °· Having depression while being pregnant. °· Having premenstrual mood issues or mood issues related to oral contraceptives. °· Having a lot of life stress. °· Having marital conflict. °· Lacking   a social support network. °· Having a baby with special needs. °· Having health problems, such as diabetes. ° °What are the signs or symptoms? °Symptoms of baby blues include: °· Brief changes in mood, such as going from extreme happiness to sadness. °· Decreased concentration. °· Difficulty sleeping. °· Crying spells, tearfulness. °· Irritability. °· Anxiety. ° °Symptoms of postpartum depression typically begin within the first month after giving birth. These symptoms include: °· Difficulty sleeping or excessive sleepiness. °· Marked weight loss. °· Agitation. °· Feelings of worthlessness. °· Lack of interest in activity or food. ° °Postpartum psychosis is a very serious condition and can be dangerous. Fortunately, it is rare. Displaying any of the following symptoms is cause for immediate medical attention. Symptoms of postpartum psychosis include: °· Hallucinations and delusions. °· Bizarre or disorganized  behavior. °· Confusion or disorientation. ° °How is this diagnosed? °A diagnosis is made by an evaluation of your symptoms. There are no medical or lab tests that lead to a diagnosis, but there are various questionnaires that a health care provider may use to identify those with the baby blues, postpartum depression, or psychosis. Often, a screening tool called the Edinburgh Postnatal Depression Scale is used to diagnose depression in the postpartum period. °How is this treated? °The baby blues usually goes away on its own in 1-2 weeks. Social support is often all that is needed. You will be encouraged to get adequate sleep and rest. Occasionally, you may be given medicines to help you sleep. °Postpartum depression requires treatment because it can last several months or longer if it is not treated. Treatment may include individual or group therapy, medicine, or both to address any social, physiological, and psychological factors that may play a role in the depression. Regular exercise, a healthy diet, rest, and social support may also be strongly recommended. °Postpartum psychosis is more serious and needs treatment right away. Hospitalization is often needed. °Follow these instructions at home: °· Get as much rest as you can. Nap when the baby sleeps. °· Exercise regularly. Some women find yoga and walking to be beneficial. °· Eat a balanced and nourishing diet. °· Do little things that you enjoy. Have a cup of tea, take a bubble bath, read your favorite magazine, or listen to your favorite music. °· Avoid alcohol. °· Ask for help with household chores, cooking, grocery shopping, or running errands as needed. Do not try to do everything. °· Talk to people close to you about how you are feeling. Get support from your partner, family members, friends, or other new moms. °· Try to stay positive in how you think. Think about the things you are grateful for. °· Do not spend a lot of time alone. °· Only take  over-the-counter or prescription medicine as directed by your health care provider. °· Keep all your postpartum appointments. °· Let your health care provider know if you have any concerns. °Contact a health care provider if: °You are having a reaction to or problems with your medicine. °Get help right away if: °· You have suicidal feelings. °· You think you may harm the baby or someone else. °This information is not intended to replace advice given to you by your health care provider. Make sure you discuss any questions you have with your health care provider. °Document Released: 11/22/2003 Document Revised: 07/26/2015 Document Reviewed: 11/29/2012 °Elsevier Interactive Patient Education © 2017 [employer] ° ° °

## 2018-06-21 NOTE — Progress Notes (Signed)
RN in the room assessing infant when MOB got up to go to the restroom and felt a large clot pass.  RN assessed clot and was approximately the size of a fist; patient did not feel dizzy.  After voiding patient returned to the bed and RN massaged fundus.  Fundus firm at U-2, no trickling.  MD updated, order received for repeat CBC tomorrow morning; will continue to monitor.

## 2018-06-21 NOTE — Lactation Note (Signed)
This note was copied from a baby's chart. Lactation Consultation Note  Patient Name: Patricia Macdonald [id number] Date: 06/21/2018 Reason for consult: Follow-up assessment;Term  Visited with P5 Mom of term baby at 63 hrs old.  Baby at 6% weight loss, good output (last stool yellow seedy)  Mom resting in bed, and baby swaddled sleeping.  Mom verbalized her concern with not having any milk, and offering baby some formula as he was acting hungry.  Reassured Mom that the more baby breastfeeds, the sooner her milk will come to volume.  Mom seemed surprised.  Supply meets demand reviewed.    Encouraged STS and feeding baby often whenever he cues.  Goal of >8 feedings per 24 hrs shared.  If baby receives formula, recommended that Mom ask for a double pump set up to support her milk supply.  Mom understands.  Encouraged Mom to call prn for assistance with positioning and latching.  Mom comfortable with breast massage and hand expression.     Consult Status Consult Status: PRN Date: 06/22/18 Follow-up type: In-patient    Judee Clara 06/21/2018, 10:59 AM

## 2018-06-21 NOTE — Progress Notes (Addendum)
Subjective: Postpartum Day # 1 : S/P NSVD due to Patricia Macdonald is a 33 y.o. female, [id number] at 41 weeks, presenting for induction of labor for postdate.  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 Patient up ad lib, denies syncope or dizziness. Reports consuming regular diet without issues and denies N/V. Patient reports 0 bowel movement + passing flatus.  Denies issues with urination and reports bleeding is "lighter."  Patient is breast and bottle feeding and reports going well.  Desires Husband to get vasecotmy for postpartum contraception.  Pain is being appropriately managed with use of po meds. Pt had elevated BP during labor, no dx, no meds, pt stable, denies HA , RUQ pain, No vision changes.   No laceration Feeding:  Breast and bottle Contraceptive plan:  Husband to get vasectomy BB: Circ In pt circ desired ([employer]) .   Objective: Vital signs in last 24 hours: Patient Vitals for the past 24 hrs:  BP Temp Temp src Pulse Resp SpO2  06/20/18 2240 106/60 98.6 F (37 C) Oral 93 18 -  06/20/18 1814 (!) 105/53 98.4 F (36.9 C) Oral 70 18 -  06/20/18 1435 119/64 98.6 F (37 C) Oral 93 18 -  06/20/18 1010 117/74 98 F (36.7 C) Oral 73 18 -  06/20/18 0905 138/81 99.1 F (37.3 C) Oral 64 18 100 %  06/20/18 0800 132/75 - - 73 18 -  06/20/18 0730 136/75 - - 83 16 -  06/20/18 0717 136/78 98 F (36.7 C) Oral 73 16 -  06/20/18 0702 (!) 141/99 - - (!) 151 17 -  06/20/18 0647 126/77 - - 93 17 -  06/20/18 0636 133/89 - - (!) 162 18 -  06/20/18 0507 (!) 143/91 - - 85 18 -  06/20/18 0401 (!) 141/91 97.7 F (36.5 C) Oral 85 17 -  06/20/18 0331 (!) 142/89 - - 83 18 -  06/20/18 0301 (!) 135/91 - - 73 17 -  06/20/18 0231 136/82 - - 74 17 -  06/20/18 0203 130/82 - - 78 16 -  06/20/18 0054 (!) 145/86 - - 76 17 -     Physical Exam:  General: alert, cooperative, appears stated age and no  distress Mood/Affect: Happy Lungs: clear to auscultation, no wheezes, rales or rhonchi, symmetric air entry.  Heart: normal rate, regular rhythm, normal S1, S2, no murmurs, rubs, clicks or gallops. Breast: breasts appear normal, no suspicious masses, no skin or nipple changes or axillary nodes. Abdomen:  + bowel sounds, soft, non-tender GU: perineum intact , healing well. No signs of external hematomas.  Uterine Fundus: firm Lochia: appropriate Skin: Warm, Dry. DVT Evaluation: No evidence of DVT seen on physical exam. Negative Homan's sign. No cords or calf tenderness. No significant calf/ankle edema.  CBC Latest Ref Rng & Units 06/20/2018 03/17/2018 12/04/2017  WBC 4.0 - 10.5 K/uL 7.9 8.8 7.3  Hemoglobin 12.0 - 15.0 g/dL 10.0(L) 10.0(L) 11.1(L)  Hematocrit 36.0 - 46.0 % 34.3(L) 33.3(L) 34.5(L)  Platelets 150 - 400 K/uL 221 216 270    No results found for this or any previous visit (from the past 24 hour(s)).   CBG (last 3)  No results for input(s): GLUCAP in the last 72 hours.   I/O last 3 completed shifts: In: 0  Out: 104 [Blood:104]   Assessment Postpartum Day # 1 : S/P NSVD due to Patricia FlorasShalanda S Macdonald is a 32  y.o. female, [id number] at 65 weeks, presenting for induction of labor for postdate.  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 Pt stable. -1 involution. Breast and bottle feeding. Hemodynamically stable with starting hgb of 10 . Pt denies s/sx of anemia.  Plan: Continue other mgmt as ordered Anemia: PO Iron, HGB pending for post delivery.  VTE prophylactics: Early ambulated as tolerates.  Pain control: Motrin/Tylenol PRN Education given regarding options for contraception, including barrier methods, injectable contraception, IUD placement, oral contraceptives.  Plan for discharge tomorrow, Breastfeeding, Lactation consult, Circumcision prior to discharge and Contraception  vasectomy for husband    Dr. Normand Sloop to be updated on patient status @ 0700  [facility] NP-C, CNM 06/21/2018, 12:20 AM

## 2018-06-22 LAB — CBC
HCT: 29.1 % — ABNORMAL LOW (ref 36.0–46.0)
Hemoglobin: 8.4 g/dL — ABNORMAL LOW (ref 12.0–15.0)
MCH: 22.3 pg — ABNORMAL LOW (ref 26.0–34.0)
MCHC: 28.9 g/dL — ABNORMAL LOW (ref 30.0–36.0)
MCV: 77.2 fL — ABNORMAL LOW (ref 80.0–100.0)
Platelets: 195 10*3/uL (ref 150–400)
RBC: 3.77 MIL/uL — ABNORMAL LOW (ref 3.87–5.11)
RDW: 19.8 % — ABNORMAL HIGH (ref 11.5–15.5)
WBC: 7.2 10*3/uL (ref 4.0–10.5)
nRBC: 0.3 % — ABNORMAL HIGH (ref 0.0–0.2)

## 2018-06-22 MED ORDER — IBUPROFEN 600 MG PO TABS: 600.0000 mg | ORAL_TABLET | Freq: Four times a day (QID) | ORAL | 0 refills | Status: DC | PRN

## 2018-06-22 NOTE — Lactation Note (Signed)
This note was copied from a baby's chart. Lactation Consultation Note  Patient Name: Patricia Macdonald Date: 06/22/2018 Reason for consult: Follow-up assessment;Term  P5 mother whose infant is now 59 hours old.  Mother breast fed her last child (now 31 years old) for 10 months.    Lab was in room obtaining a serum bilirubin and pediatrician was at bedside waiting to perform physical assessment.    Mother stated she is giving formula in addition to breast feeding because she doesn't have "any milk" and "he is always acting hungry."  Mother stated she fed him "all night long."  After reviewing breast feeding basics with mother I offered to assist with latching since it had been 3 hours since baby last fed.  Mother accepted.  Mother's breasts are large, soft and non tender and nipples are large, everted and intact.  Educated mother on the importance of obtaining a wide mouth prior to latching and to be patient until he is ready.  Once latched I demonstrated how to maintain a deep latch and to do breast compressions intermittently.  Baby began sucking rhythmically and audible swallows noted.  Mother stated that she has never heard him swallow before.  She denied pain with latching.  Positioned blanket rolls for greater comfort and observed him feeding for 12 minutes prior to my departure.  Involved father in the learning process and he is willing to assist mother as needed.  Mother will continue to supplement after breast feeding.  Parents will wait on bilirubin level and pediatrician will return with results.  Parents are hoping to be discharged later today.  Encouraged mother to call for further latch assistance as needed.     Maternal Data Formula Feeding for Exclusion: Yes Reason for exclusion: Mother's choice to formula and breast feed on admission Has patient been taught Hand Expression?: Yes Does the patient have breastfeeding experience prior to this delivery?:  Yes  Feeding Feeding Type: Breast Fed  LATCH Score Latch: Grasps breast easily, tongue down, lips flanged, rhythmical sucking.  Audible Swallowing: Spontaneous and intermittent  Type of Nipple: Everted at rest and after stimulation  Comfort (Breast/Nipple): Soft / non-tender  Hold (Positioning): Assistance needed to correctly position infant at breast and maintain latch.  LATCH Score: 9  Interventions Interventions: Breast feeding basics reviewed;Assisted with latch;Skin to skin;Breast massage;Hand express;Breast compression;Position options;Support pillows;Adjust position  Lactation Tools Discussed/Used     Consult Status Consult Status: Complete Date: 06/22/18 Follow-up type: Call as needed    Patricia Macdonald [name] 06/22/2018, 10:32 AM

## 2018-06-22 NOTE — Lactation Note (Signed)
This note was copied from a baby's chart. Lactation Consultation Note  Patient Name: Patricia Macdonald Date: 06/22/2018     [facility] Follow Up Visit:  Second attempt to visit with mother, however, she remains sleeping.  Will try again later this morning.                     Patricia Macdonald 06/22/2018, 8:31 AM

## 2018-06-22 NOTE — Lactation Note (Signed)
This note was copied from a baby's chart. Lactation Consultation Note  Patient Name: Patricia Macdonald [id number] Date: 06/22/2018     [facility] Follow Up Visit:  Attempted to visit with mother, however, she was sleeping.  Will return later this morning.                 Inigo Lantigua R Kaisey Huseby 06/22/2018, 8:30 AM

## 2018-08-02 DIAGNOSIS — Z3040 Encounter for surveillance of contraceptives, unspecified: Secondary | ICD-10-CM | POA: Diagnosis not present

## 2018-12-18 IMAGING — US US OB COMP LESS 14 WK
1 series · 15 of 28 positions shown · non-contrast
Comparison: None.

CLINICAL DATA: Abdominal cramping

EXAM:
OBSTETRIC <14 WK US AND TRANSVAGINAL OB US
TECHNIQUE: Both transabdominal and transvaginal ultrasound examinations were
performed for complete evaluation of the gestation as well as the
maternal uterus, adnexal regions, and pelvic cul-de-sac.
Transvaginal technique was performed to assess early pregnancy.

[Series 1: us ob comp less 14 wk · 67 acquisitions, 15 frames shown]
[im 1/67]
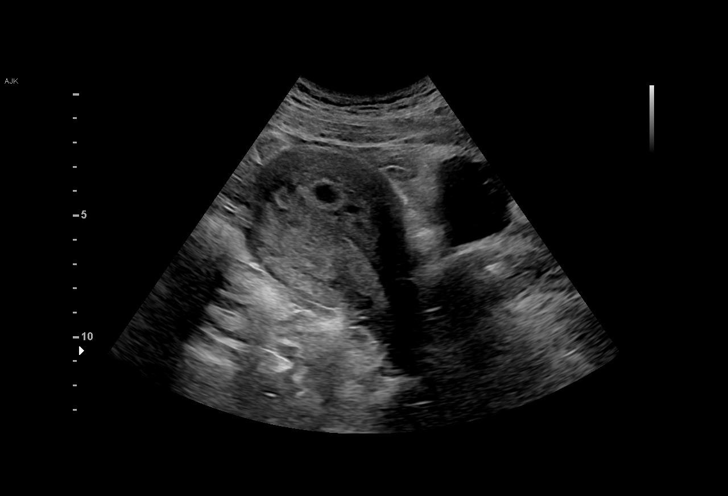
[im 5/67]
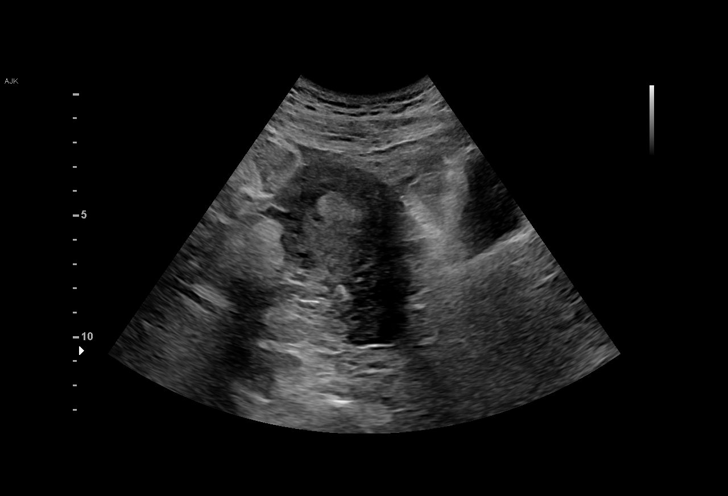
[im 10/67]
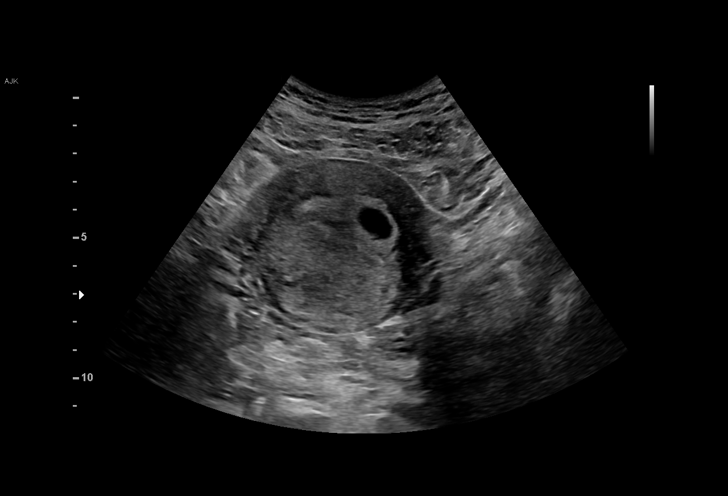
[im 15/67]
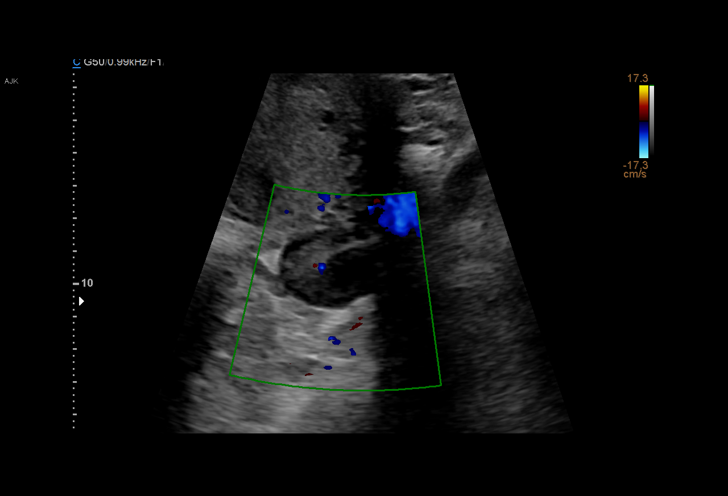
[im 20/67]
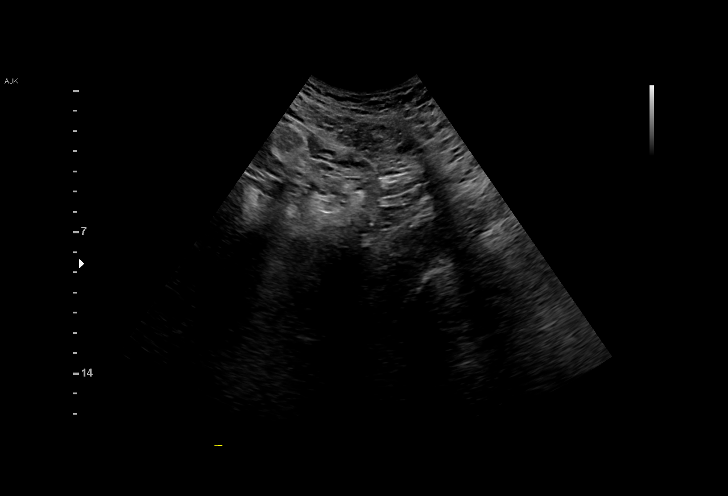
[im 25/67]
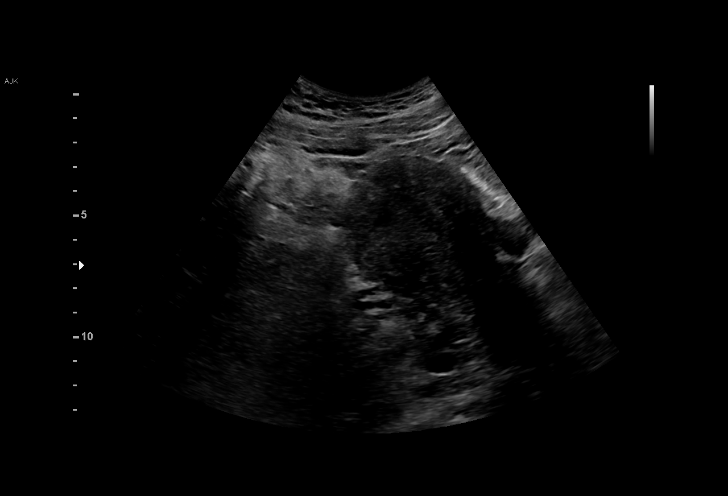
[im 30/67]
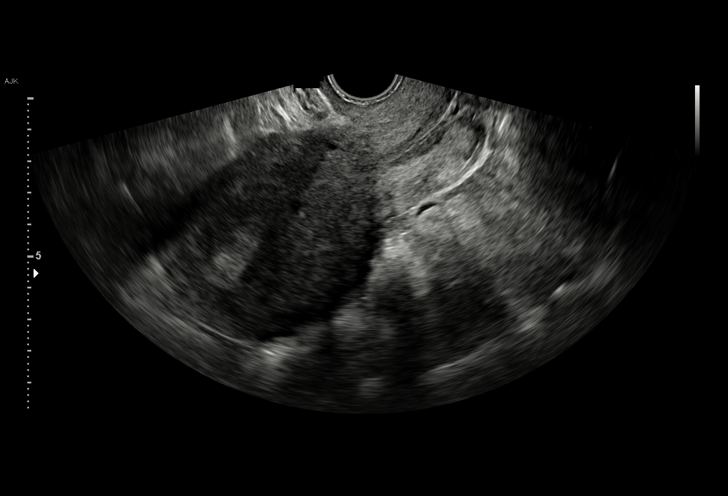
[im 35/67]
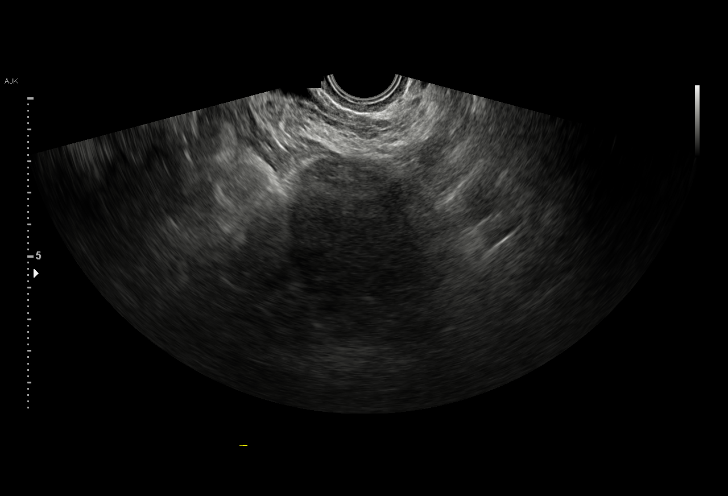
[im 37/67]
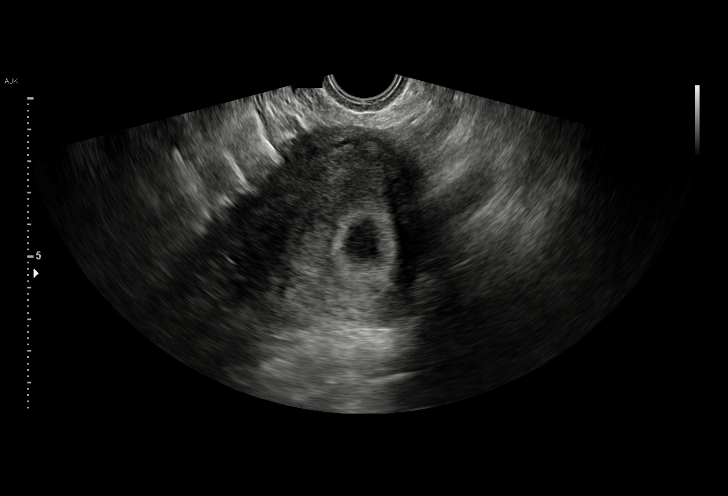
[im 42/67]
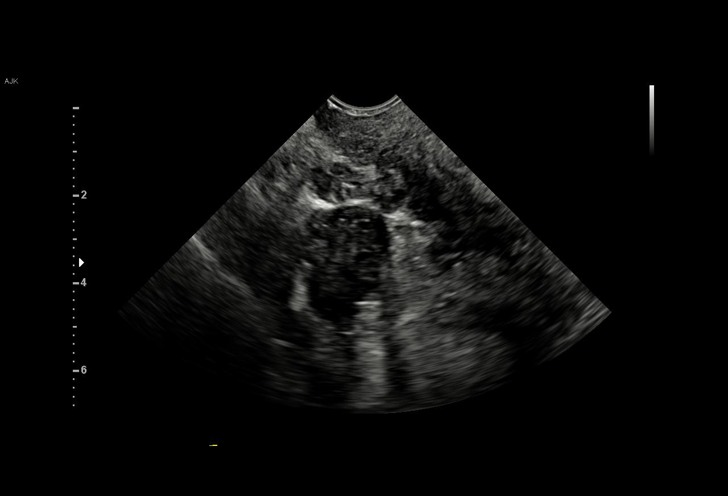
[im 47/67]
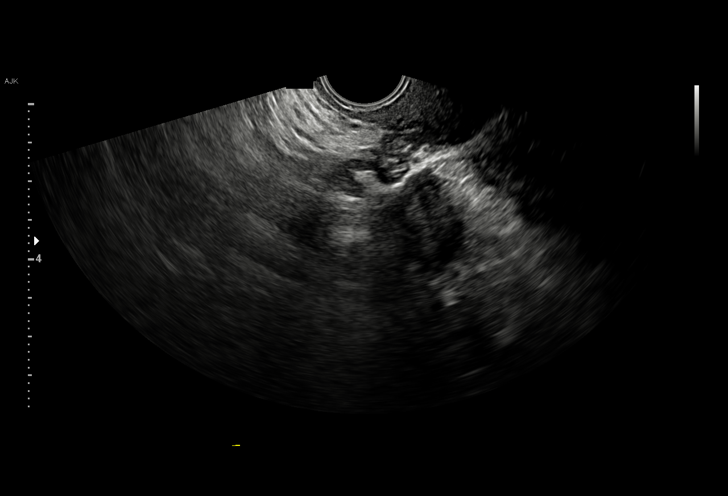
[im 52/67]
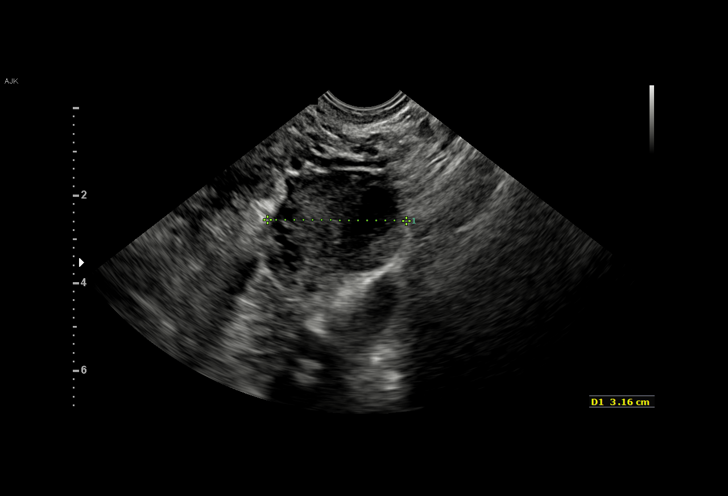
[im 57/67]
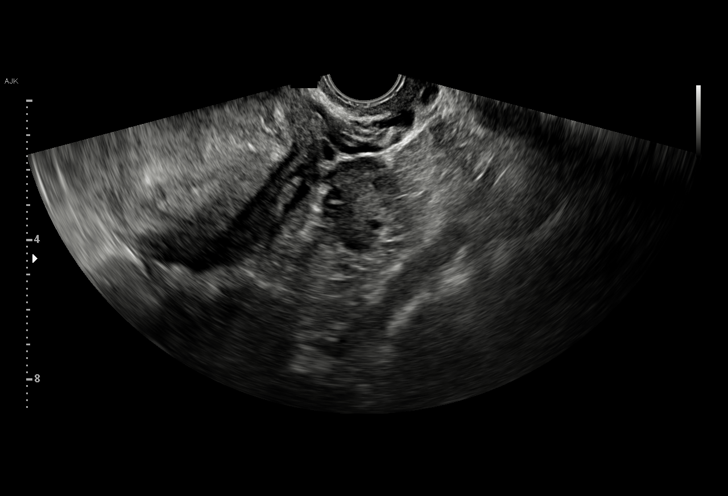
[im 62/67]
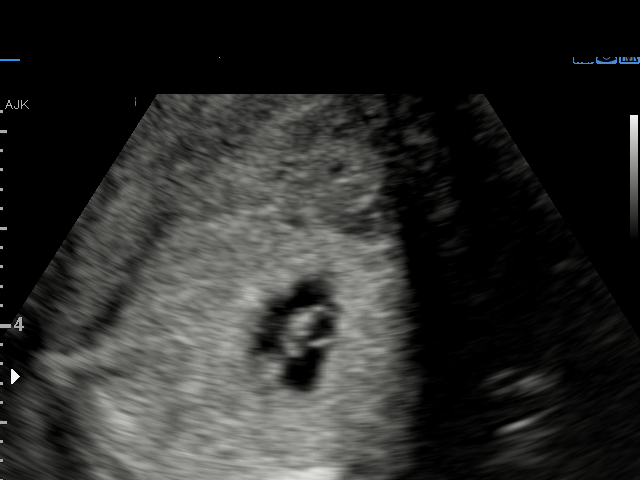
[im 67/67]
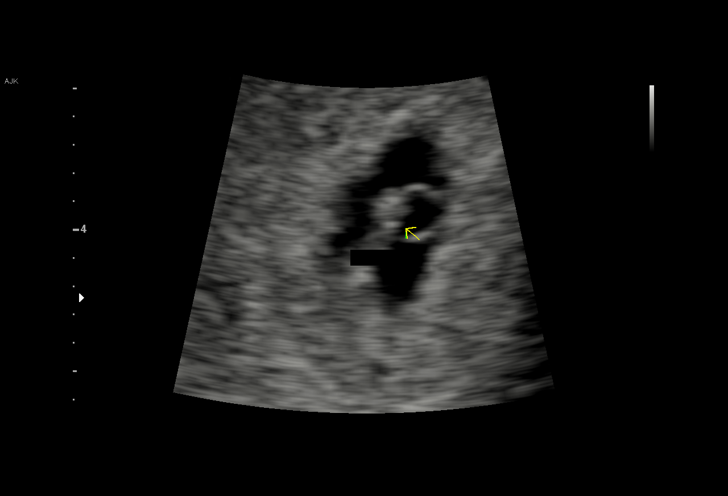

[15 of 28 positions shown; findings below may reference images not displayed]

FINDINGS: Intrauterine gestational sac: Visualized

Yolk sac:  Visualized

Embryo:  Visualized

Cardiac Activity: Visualized

Heart Rate: 109 bpm

CRL:  3 mm   5 w   5 d                  US EDC: June 18, 2018

Subchorionic hemorrhage:  None visualized.

Maternal uterus/adnexae: Cervical os is closed. Right ovary measures
2.8 x 2.1 x 2.2 cm. Left ovary measures 3.2 x 2.8 x 3.2 cm. No
extrauterine pelvic mass. No free pelvic fluid.
IMPRESSION: Single live intrauterine gestation with estimated gestational age is
6-weeks. No subchorionic hemorrhage. Study otherwise unremarkable.

## 2019-10-05 ENCOUNTER — Encounter (INDEPENDENT_AMBULATORY_CARE_PROVIDER_SITE_OTHER): Payer: Self-pay

## 2019-11-03 ENCOUNTER — Ambulatory Visit (INDEPENDENT_AMBULATORY_CARE_PROVIDER_SITE_OTHER): Payer: Medicaid Other | Admitting: Family Medicine

## 2019-11-03 ENCOUNTER — Other Ambulatory Visit: Payer: Self-pay

## 2019-11-03 ENCOUNTER — Encounter (INDEPENDENT_AMBULATORY_CARE_PROVIDER_SITE_OTHER): Payer: Self-pay | Admitting: Family Medicine

## 2019-11-03 VITALS — BP 117/78 | HR 82 | Temp 98.7°F | Ht 68.0 in | Wt 220.0 lb

## 2019-11-03 DIAGNOSIS — Z809 Family history of malignant neoplasm, unspecified: Secondary | ICD-10-CM | POA: Diagnosis not present

## 2019-11-03 DIAGNOSIS — Z6833 Body mass index (BMI) 33.0-33.9, adult: Secondary | ICD-10-CM | POA: Diagnosis not present

## 2019-11-03 DIAGNOSIS — Z0289 Encounter for other administrative examinations: Secondary | ICD-10-CM

## 2019-11-03 DIAGNOSIS — E669 Obesity, unspecified: Secondary | ICD-10-CM | POA: Diagnosis not present

## 2019-11-03 DIAGNOSIS — R0602 Shortness of breath: Secondary | ICD-10-CM | POA: Diagnosis not present

## 2019-11-03 DIAGNOSIS — R5383 Other fatigue: Secondary | ICD-10-CM | POA: Diagnosis not present

## 2019-11-03 DIAGNOSIS — K582 Mixed irritable bowel syndrome: Secondary | ICD-10-CM | POA: Insufficient documentation

## 2019-11-03 DIAGNOSIS — Z83438 Family history of other disorder of lipoprotein metabolism and other lipidemia: Secondary | ICD-10-CM | POA: Insufficient documentation

## 2019-11-03 DIAGNOSIS — K588 Other irritable bowel syndrome: Secondary | ICD-10-CM

## 2019-11-03 DIAGNOSIS — Z833 Family history of diabetes mellitus: Secondary | ICD-10-CM

## 2019-11-03 DIAGNOSIS — Z1331 Encounter for screening for depression: Secondary | ICD-10-CM | POA: Diagnosis not present

## 2019-11-03 DIAGNOSIS — Z8349 Family history of other endocrine, nutritional and metabolic diseases: Secondary | ICD-10-CM | POA: Insufficient documentation

## 2019-11-03 DIAGNOSIS — Z8249 Family history of ischemic heart disease and other diseases of the circulatory system: Secondary | ICD-10-CM | POA: Insufficient documentation

## 2019-11-04 LAB — CBC WITH DIFFERENTIAL/PLATELET
Basophils Absolute: 0 10*3/uL (ref 0.0–0.2)
Basos: 1 %
EOS (ABSOLUTE): 0.1 10*3/uL (ref 0.0–0.4)
Eos: 3 %
Hematocrit: 35.4 % (ref 34.0–46.6)
Hemoglobin: 10.1 g/dL — ABNORMAL LOW (ref 11.1–15.9)
Immature Grans (Abs): 0 10*3/uL (ref 0.0–0.1)
Immature Granulocytes: 0 %
Lymphocytes Absolute: 1.5 10*3/uL (ref 0.7–3.1)
Lymphs: 32 %
MCH: 20.8 pg — ABNORMAL LOW (ref 26.6–33.0)
MCHC: 28.5 g/dL — ABNORMAL LOW (ref 31.5–35.7)
MCV: 73 fL — ABNORMAL LOW (ref 79–97)
Monocytes Absolute: 0.3 10*3/uL (ref 0.1–0.9)
Monocytes: 6 %
Neutrophils Absolute: 2.6 10*3/uL (ref 1.4–7.0)
Neutrophils: 58 %
Platelets: 398 10*3/uL (ref 150–450)
RBC: 4.85 x10E6/uL (ref 3.77–5.28)
RDW: 16.9 % — ABNORMAL HIGH (ref 11.7–15.4)
WBC: 4.5 10*3/uL (ref 3.4–10.8)

## 2019-11-04 LAB — COMPREHENSIVE METABOLIC PANEL
ALT: 20 IU/L (ref 0–32)
AST: 16 IU/L (ref 0–40)
Albumin/Globulin Ratio: 1.4 (ref 1.2–2.2)
Albumin: 4.5 g/dL (ref 3.8–4.8)
Alkaline Phosphatase: 106 IU/L (ref 48–121)
BUN/Creatinine Ratio: 13 (ref 9–23)
BUN: 10 mg/dL (ref 6–20)
Bilirubin Total: 0.4 mg/dL (ref 0.0–1.2)
CO2: 23 mmol/L (ref 20–29)
Calcium: 9.5 mg/dL (ref 8.7–10.2)
Chloride: 102 mmol/L (ref 96–106)
Creatinine, Ser: 0.77 mg/dL (ref 0.57–1.00)
GFR calc Af Amer: 119 mL/min/{1.73_m2} (ref >59)
GFR calc non Af Amer: 103 mL/min/{1.73_m2} (ref >59)
Globulin, Total: 3.2 g/dL (ref 1.5–4.5)
Glucose: 89 mg/dL (ref 65–99)
Potassium: 4.6 mmol/L (ref 3.5–5.2)
Sodium: 137 mmol/L (ref 134–144)
Total Protein: 7.7 g/dL (ref 6.0–8.5)

## 2019-11-04 LAB — LIPID PANEL WITH LDL/HDL RATIO
Cholesterol, Total: 178 mg/dL (ref 100–199)
HDL: 42 mg/dL (ref >39)
LDL Chol Calc (NIH): 125 mg/dL — ABNORMAL HIGH (ref 0–99)
LDL/HDL Ratio: 3 ratio (ref 0.0–3.2)
Triglycerides: 59 mg/dL (ref 0–149)
VLDL Cholesterol Cal: 11 mg/dL (ref 5–40)

## 2019-11-04 LAB — FOLATE: Folate: 9.7 ng/mL (ref >3.0)

## 2019-11-04 LAB — VITAMIN B12: Vitamin B-12: 543 pg/mL (ref 232–1245)

## 2019-11-04 LAB — T4, FREE: Free T4: 1.09 ng/dL (ref 0.82–1.77)

## 2019-11-04 LAB — TSH: TSH: 1.42 u[IU]/mL (ref 0.450–4.500)

## 2019-11-04 LAB — HEMOGLOBIN A1C
Est. average glucose Bld gHb Est-mCnc: 140 mg/dL
Hgb A1c MFr Bld: 6.5 % — ABNORMAL HIGH (ref 4.8–5.6)

## 2019-11-04 LAB — T3: T3, Total: 91 ng/dL (ref 71–180)

## 2019-11-04 LAB — INSULIN, RANDOM: INSULIN: 16.8 u[IU]/mL (ref 2.6–24.9)

## 2019-11-04 LAB — VITAMIN D 25 HYDROXY (VIT D DEFICIENCY, FRACTURES): Vit D, 25-Hydroxy: 19.7 ng/mL — ABNORMAL LOW (ref 30.0–100.0)

## 2019-11-08 NOTE — Progress Notes (Addendum)
Chief Complaint:   OBESITY Patricia Macdonald (MR# [id number]) is a 34 y.o. female who presents for evaluation and treatment of obesity and related comorbidities. Current BMI is Body mass index is 33.45 kg/m. Patricia Macdonald has been struggling with her weight for many years and has been unsuccessful in either losing weight, maintaining weight loss, or reaching her healthy weight goal.  Patricia Macdonald is currently in the action stage of change and ready to dedicate time achieving and maintaining a healthier weight. Patricia Macdonald is interested in becoming our patient and working on intensive lifestyle modifications including (but not limited to) diet and exercise for weight loss.  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  She lives with her husband, Patricia Macdonald, and her 5 children.  Patricia Macdonald's habits were reviewed today and are as follows: Her family eats meals together, she thinks her family will eat healthier with her, her desired weight loss is 76 pounds, she started gaining weight after having children, her heaviest weight ever was 226 pounds, she craves pasta and bread, she skips breakfast and sometimes dinner frequently, she frequently makes poor food choices, she has problems with excessive hunger, she frequently eats larger portions than normal and she struggles with emotional eating.  Depression Screen Patricia Macdonald's Food and Mood (modified PHQ-9) score was 10.  Depression screen [facility] 2/9 11/03/2019  Decreased Interest 1  Down, Depressed, Hopeless 1  PHQ - 2 Score 2  Altered sleeping 0  Tired, decreased energy 1  Change in appetite 1  Feeling bad or failure about yourself  0  Trouble concentrating 0  Moving slowly or fidgety/restless 0  PHQ-9 Score 4   Subjective:   1. Other fatigue Patricia Macdonald denies daytime somnolence and admits to waking up still tired. Patent has a history of symptoms of morning fatigue. Patricia Macdonald generally gets 8-10 hours of sleep per night, and states that she  has generally restful sleep. Snoring is not present. Apneic episodes is not present. Epworth Sleepiness Score is 4.  2. Shortness of breath on exertion Rumaisa notes increasing shortness of breath with exercising and seems to be worsening over time with weight gain. She notes getting out of breath sooner with activity than she used to. This has gotten worse recently. Patricia Macdonald denies shortness of breath at rest or orthopnea.  3. Other irritable bowel syndrome Patricia Macdonald has the diagnosis of irritable bowel syndrome.  4. Family history of diabetes mellitus (DM) Patricia Macdonald has a family history of diabetes in her mother and father.  5. Family history of hyperlipidemia There is a family history of hyperlipidemia in Patricia Macdonald's father.  6. Family history of cancer Patricia Macdonald has a family history of cancer.  7. Depression screening Patricia Macdonald was screened for depression as part of her new patient workup.  PHQ-9 is 10.  Assessment/Plan:   This is the patient's first visit at [facility].  The patient's NEW PATIENT PACKET was reviewed at length. Included in the packet: current and past health history, medications, allergies, ROS, gynecologic history (women only), surgical history, family history, social history, weight history, weight loss surgery history (for those that have had weight loss surgery), nutritional evaluation, mood and food questionnaire along with a depression screening (PHQ9) on all patients, an Epworth questionnaire, sleep habits questionnaire, patient life and health improvement goals questionnaire. These will all be scanned into the patient's chart under media.   During the visit, I independently reviewed the patient's EKG, bioimpedance scale results, and indirect calorimeter results. I used  this information to tailor a meal plan for the patient that will help Patricia Macdonald to lose weight and will improve her obesity-related conditions going forward. I performed a medically  necessary appropriate examination and/or evaluation. I discussed the assessment and treatment plan with the patient. The patient was provided an opportunity to ask questions and all were answered. The patient agreed with the plan and demonstrated an understanding of the instructions. Labs were ordered at this visit and will be reviewed at the next visit unless more critical results need to be addressed immediately. Clinical information was updated and documented in the EMR.   Time spent on visit including pre-visit chart review and post-visit care was estimated to be 60-74 minutes.  A separate 15 minutes was spent on risk counseling (see above).    1. Other fatigue Patricia Macdonald does feel that her weight is causing her energy to be lower than it should be. Fatigue may be related to obesity, depression or many other causes. Labs will be ordered, and in the meanwhile, Patricia Macdonald will focus on self care including making healthy food choices, increasing physical activity and focusing on stress reduction.  - EKG 12-Lead - Vitamin B12 - CBC with Differential/Platelet - Comprehensive metabolic panel - Folate - Hemoglobin A1c - Insulin, random - Lipid Panel With LDL/HDL Ratio - T3 - T4, free - TSH - VITAMIN D 25 Hydroxy (Vit-D Deficiency, Fractures)  2. Shortness of breath on exertion Patricia Macdonald does feel that she gets out of breath more easily that she used to when she exercises. Patricia Macdonald's shortness of breath appears to be obesity related and exercise induced. She has agreed to work on weight loss and gradually increase exercise to treat her exercise induced shortness of breath. Will continue to monitor closely.  - EKG 12-Lead - Vitamin B12 - CBC with Differential/Platelet - Comprehensive metabolic panel - Folate - Hemoglobin A1c - Insulin, random - Lipid Panel With LDL/HDL Ratio - T3 - T4, free - TSH - VITAMIN D 25 Hydroxy (Vit-D Deficiency, Fractures)  3. Other irritable bowel syndrome Will  check labs today.  - Vitamin B12 - CBC with Differential/Platelet - Comprehensive metabolic panel - Folate - Hemoglobin A1c - T3 - T4, free - TSH - VITAMIN D 25 Hydroxy (Vit-D Deficiency, Fractures)  4. Family history of diabetes mellitus (DM) Check labs (she understands the increased risk of diagnosis due to genetics).  - Hemoglobin A1c - Insulin, random  5. Family history of hyperlipidemia Check labs (she understands the increased risk of diagnosis due to genetics).  - Comprehensive metabolic panel - Lipid Panel With LDL/HDL Ratio  6. Family history of cancer Check labs (she understands the increased risk of diagnosis due to genetics).  7. Depression screening Nedra had a positive depression screening. Depression is commonly associated with obesity and often results in emotional eating behaviors. We will monitor this closely and work on CBT to help improve the non-hunger eating patterns. Referral to Psychology may be required if no improvement is seen as she continues in our clinic.  8. Class 1 Obesity with serious comorbidity and body mass index (BMI) of 33.0 to 33.9 in adult, unspecified obesity type.  Patricia Macdonald is currently in the action stage of change and her goal is to continue with weight loss efforts. I recommend Patricia Macdonald begin the structured treatment plan as follows:  She has agreed to the Category 1 Plan.  Exercise goals: As is.   Behavioral modification strategies: increasing lean protein intake, decreasing simple carbohydrates, increasing water intake, no  skipping meals, meal planning and cooking strategies and planning for success.  She was informed of the importance of frequent follow-up visits to maximize her success with intensive lifestyle modifications for her multiple health conditions. She was informed we would discuss her lab results at her next visit unless there is a critical issue that needs to be addressed sooner. Patricia Macdonald agreed to keep her next  visit at the agreed upon time to discuss these results.  Objective:   Blood pressure 117/78, pulse 82, temperature 98.7 F (37.1 C), height 5\' 8"  (1.727 m), weight 220 lb (99.8 kg), SpO2 98 %, unknown if currently breastfeeding. Body mass index is 33.45 kg/m.  EKG: Normal sinus rhythm, rate 86 bpm.  Indirect Calorimeter completed today shows a VO2 of 241 and a REE of 1676.  Her calculated basal metabolic rate is thus her basal metabolic rate is worse than expected.  General: Cooperative, alert, well developed, in no acute distress. HEENT: Conjunctivae and lids unremarkable. Cardiovascular: Regular rhythm.  Lungs: Normal work of breathing. Neurologic: No focal deficits.   Lab Results  Component Value Date   CREATININE 0.77 11/03/2019   BUN 10 11/03/2019   NA 137 11/03/2019   K 4.6 11/03/2019   CL 102 11/03/2019   CO2 23 11/03/2019   Lab Results  Component Value Date   ALT 20 11/03/2019   AST 16 11/03/2019   ALKPHOS 106 11/03/2019   BILITOT 0.4 11/03/2019   Lab Results  Component Value Date   HGBA1C 6.5 (H) 11/03/2019   Lab Results  Component Value Date   INSULIN 16.8 11/03/2019   Lab Results  Component Value Date   TSH 1.420 11/03/2019   Lab Results  Component Value Date   CHOL 178 11/03/2019   HDL 42 11/03/2019   LDLCALC 125 (H) 11/03/2019   TRIG 59 11/03/2019   Lab Results  Component Value Date   WBC 4.5 11/03/2019   HGB 10.1 (L) 11/03/2019   HCT 35.4 11/03/2019   MCV 73 (L) 11/03/2019   PLT 398 11/03/2019   Attestation Statements:   Reviewed by clinician on day of visit: allergies, medications, problem list, medical history, surgical history, family history, social history, and previous encounter notes.  I, 01/03/2020, CMA, am acting as Insurance claims handler for Energy manager, DO.  I have reviewed the above documentation for accuracy and completeness, and I agree with the above. [employer], DO

## 2019-11-17 ENCOUNTER — Ambulatory Visit (INDEPENDENT_AMBULATORY_CARE_PROVIDER_SITE_OTHER): Payer: Medicaid Other | Admitting: Family Medicine

## 2019-11-17 ENCOUNTER — Other Ambulatory Visit: Payer: Self-pay

## 2019-11-17 ENCOUNTER — Encounter (INDEPENDENT_AMBULATORY_CARE_PROVIDER_SITE_OTHER): Payer: Self-pay | Admitting: Family Medicine

## 2019-11-17 ENCOUNTER — Encounter (INDEPENDENT_AMBULATORY_CARE_PROVIDER_SITE_OTHER): Payer: Self-pay

## 2019-11-17 ENCOUNTER — Telehealth (INDEPENDENT_AMBULATORY_CARE_PROVIDER_SITE_OTHER): Payer: Medicaid Other | Admitting: Family Medicine

## 2019-11-17 VITALS — BP 106/70 | HR 111 | Ht 68.0 in

## 2019-11-17 DIAGNOSIS — E785 Hyperlipidemia, unspecified: Secondary | ICD-10-CM

## 2019-11-17 DIAGNOSIS — E559 Vitamin D deficiency, unspecified: Secondary | ICD-10-CM | POA: Diagnosis not present

## 2019-11-17 DIAGNOSIS — E118 Type 2 diabetes mellitus with unspecified complications: Secondary | ICD-10-CM | POA: Diagnosis not present

## 2019-11-17 DIAGNOSIS — Z6833 Body mass index (BMI) 33.0-33.9, adult: Secondary | ICD-10-CM

## 2019-11-17 DIAGNOSIS — D508 Other iron deficiency anemias: Secondary | ICD-10-CM | POA: Diagnosis not present

## 2019-11-17 DIAGNOSIS — E1169 Type 2 diabetes mellitus with other specified complication: Secondary | ICD-10-CM | POA: Diagnosis not present

## 2019-11-17 DIAGNOSIS — D509 Iron deficiency anemia, unspecified: Secondary | ICD-10-CM

## 2019-11-17 DIAGNOSIS — E669 Obesity, unspecified: Secondary | ICD-10-CM

## 2019-11-17 DIAGNOSIS — Z5329 Procedure and treatment not carried out because of patient's decision for other reasons: Secondary | ICD-10-CM

## 2019-11-17 MED ORDER — FERROUS SULFATE 325 (65 FE) MG PO TBEC: 325.0000 mg | DELAYED_RELEASE_TABLET | Freq: Two times a day (BID) | ORAL | 0 refills | Status: DC

## 2019-11-17 MED ORDER — VITAMIN D (ERGOCALCIFEROL) 1.25 MG (50000 UNIT) PO CAPS: 50000.0000 [IU] | ORAL_CAPSULE | ORAL | 0 refills | Status: DC

## 2019-11-22 ENCOUNTER — Telehealth (INDEPENDENT_AMBULATORY_CARE_PROVIDER_SITE_OTHER): Payer: Medicaid Other | Admitting: Family Medicine

## 2019-11-22 NOTE — Progress Notes (Signed)
TeleHealth Visit:  Due to the COVID-19 pandemic, this visit was completed with telemedicine (audio/video) technology to reduce patient and provider exposure as well as to preserve personal protective equipment.   Patricia Macdonald has verbally consented to this TeleHealth visit. The patient is located at home, the provider is located at the [facility] office. The participants in this visit include the listed provider and patient. The visit was conducted today via video.  Chief Complaint: OBESITY Patricia Macdonald is here to discuss her progress with her obesity treatment plan along with follow-up of her obesity related diagnoses. Patricia Macdonald is on the Category 1 Plan and states she is following her eating plan approximately 20-30% of the time. Patricia Macdonald states she is exercising for 0 minutes 0 times per week.  Today's visit was #: 2 Starting weight: 220 lbs Starting date: 11/03/2019  Interim History: This is Patricia Macdonald's first follow-up visit.  She came in, and her screening oral temperature was 101.2, pulse 111.  She was asymptomatic.  Office visit was converted to telehealth.  She found it to be too much food.  Maybe ate twice daily and did not get proteins in.  Will need recheck of A1c, insulin, FLP, CBC, vitamin D and anemia panel (except B12/folate) in 2.5 months.  Subjective:   1. Controlled type 2 diabetes mellitus with complication, without long-term current use of insulin (HCC) Medications reviewed. Diabetic ROS: no polyuria or polydipsia, no chest pain, dyspnea or TIA's, no numbness, tingling or pain in extremities.  Positive family history of diabetes and no personal history of gestational diabetes or prediabetes, etc.  Lab Results  Component Value Date   HGBA1C 6.5 (H) 11/03/2019   Lab Results  Component Value Date   LDLCALC 125 (H) 11/03/2019   CREATININE 0.77 11/03/2019   Lab Results  Component Value Date   INSULIN 16.8 11/03/2019   2. Hyperlipidemia associated with type 2  diabetes mellitus (HCC) Patricia Macdonald has hyperlipidemia and has been trying to improve her cholesterol levels with intensive lifestyle modification including a low saturated fat diet, exercise and weight loss. She denies any chest pain, claudication or myalgias.  Elevated LDL.  denies history of elevated LDL in the past.  Lab Results  Component Value Date   ALT 20 11/03/2019   AST 16 11/03/2019   ALKPHOS 106 11/03/2019   BILITOT 0.4 11/03/2019   Lab Results  Component Value Date   CHOL 178 11/03/2019   HDL 42 11/03/2019   LDLCALC 125 (H) 11/03/2019   TRIG 59 11/03/2019   3. Other iron deficiency anemia Patricia Macdonald is not a vegetarian.  She does not have a history of weight loss surgery.  She states today that she has been told she has iron deficiency and needs to take iron but just has not due to constipation.   CBC Latest Ref Rng & Units 11/03/2019 06/22/2018 06/21/2018  WBC 3.4 - 10.8 x10E3/uL 4.5 7.2 8.5  Hemoglobin 11.1 - 15.9 g/dL 10.1(L) 8.4(L) 8.5(L)  Hematocrit 34.0 - 46.6 % 35.4 29.1(L) 29.6(L)  Platelets 150 - 450 x10E3/uL 398 195 188   Lab Results  Component Value Date   VITAMINB12 543 11/03/2019   4. Vitamin D deficiency Patricia Macdonald's Vitamin D level was 19.7 on 11/03/2019. She is currently taking no vitamin D supplement. She denies nausea, vomiting or muscle weakness.  Assessment/Plan:   1. Controlled type 2 diabetes mellitus with complication, without long-term current use of insulin (HCC) New.  Discussed labs with patient today.  Good blood sugar  control is important to decrease the likelihood of diabetic complications such as nephropathy, neuropathy, limb loss, blindness, coronary artery disease, and death. Intensive lifestyle modification including diet, exercise and weight loss are the first line of treatment for diabetes.  New onset diabetic.  I recommend she start metformin, but she declined today.  She wants to try diet/lifestyle.  Recheck labs in 3 months.  2.  Hyperlipidemia associated with type 2 diabetes mellitus (HCC) New.  Discussed labs with patient today.  Cardiovascular risk and specific lipid/LDL goals reviewed.  We discussed several lifestyle modifications today and Patricia Macdonald will continue to work on diet, exercise and weight loss efforts. Orders and follow up as documented in patient record.  She declines medications.  Wants to try to eat better/decrease sat/trans fats.  Recheck labs in 3 months.  Counseling Intensive lifestyle modifications are the first line treatment for this issue. . Dietary changes: Increase soluble fiber. Decrease simple carbohydrates. . Exercise changes: Moderate to vigorous-intensity aerobic activity 150 minutes per week if tolerated. . Lipid-lowering medications: see documented in medical record.  3. Other iron deficiency anemia New.  Discussed labs with patient today.  Ferrous sulfate 325 mg.bid.  MiraLAX daily OTC.  Increase water intake.  Recheck labs in 2-3 months.    Counseling . Iron is essential for our bodies to make red blood cells.  Reasons that someone may be deficient include: an iron-deficient diet (more likely in those following vegan or vegetarian diets), women with heavy menses, patients with GI disorders or poor absorption, patients that have had bariatric surgery, frequent blood donors, patients with cancer, and patients with heart disease.   Gaspar Cola foods include dark leafy greens, red and white meats, eggs, seafood, and beans.   . Certain foods and drinks prevent your body from absorbing iron properly. Avoid eating these foods in the same meal as iron-rich foods or with iron supplements. These foods include: coffee, black tea, and red wine; milk, dairy products, and foods that are high in calcium; beans and soybeans; whole grains.  . Constipation can be a side effect of iron supplementation. Increased water and fiber intake are helpful. Water goal: > 2 liters/day. Fiber goal: > 25 grams/day.  4.  Vitamin D deficiency Low Vitamin D level contributes to fatigue and are associated with obesity, breast, and colon cancer. She agrees to start to take prescription Vitamin D @50 ,000 IU every week and will follow-up for routine testing of Vitamin D, at least 2-3 times per year to avoid over-replacement.  5. Class 1 obesity with serious comorbidity and body mass index (BMI) of 33.0 to 33.9 in adult, unspecified obesity type Patricia Macdonald is currently in the action stage of change. As such, her goal is to continue with weight loss efforts. She has agreed to the Category 1 Plan.   Exercise goals: As is.  Behavioral modification strategies: increasing lean protein intake, decreasing simple carbohydrates, decreasing liquid calories, better snacking choices and planning for success.  Patricia Macdonald has agreed to follow-up with our clinic in 2 weeks. She was informed of the importance of frequent follow-up visits to maximize her success with intensive lifestyle modifications for her multiple health conditions.  Objective:   VITALS: Per patient if applicable, see vitals. GENERAL: Alert and in no acute distress. CARDIOPULMONARY: No increased WOB. Speaking in clear sentences.  PSYCH: Pleasant and cooperative. Speech normal rate and rhythm. Affect is appropriate. Insight and judgement are appropriate. Attention is focused, linear, and appropriate.  NEURO: Oriented as arrived to appointment on  time with no prompting.   Lab Results  Component Value Date   CREATININE 0.77 11/03/2019   BUN 10 11/03/2019   NA 137 11/03/2019   K 4.6 11/03/2019   CL 102 11/03/2019   CO2 23 11/03/2019   Lab Results  Component Value Date   ALT 20 11/03/2019   AST 16 11/03/2019   ALKPHOS 106 11/03/2019   BILITOT 0.4 11/03/2019   Lab Results  Component Value Date   HGBA1C 6.5 (H) 11/03/2019   Lab Results  Component Value Date   INSULIN 16.8 11/03/2019   Lab Results  Component Value Date   TSH 1.420 11/03/2019   Lab  Results  Component Value Date   CHOL 178 11/03/2019   HDL 42 11/03/2019   LDLCALC 125 (H) 11/03/2019   TRIG 59 11/03/2019   Lab Results  Component Value Date   WBC 4.5 11/03/2019   HGB 10.1 (L) 11/03/2019   HCT 35.4 11/03/2019   MCV 73 (L) 11/03/2019   PLT 398 11/03/2019   Attestation Statements:   Reviewed by clinician on day of visit: allergies, medications, problem list, medical history, surgical history, family history, social history, and previous encounter notes.  I, Insurance claims handler, CMA, am acting as Energy manager for [employer], DO.  I have reviewed the above documentation for accuracy and completeness, and I agree with the above. Thomasene Lot, DO

## 2019-11-29 DIAGNOSIS — U071 COVID-19: Secondary | ICD-10-CM | POA: Diagnosis not present

## 2019-11-29 DIAGNOSIS — T783XXA Angioneurotic edema, initial encounter: Secondary | ICD-10-CM | POA: Diagnosis not present

## 2019-11-29 DIAGNOSIS — J40 Bronchitis, not specified as acute or chronic: Secondary | ICD-10-CM | POA: Diagnosis not present

## 2020-04-12 ENCOUNTER — Encounter (INDEPENDENT_AMBULATORY_CARE_PROVIDER_SITE_OTHER): Payer: Self-pay

## 2020-04-29 DIAGNOSIS — E119 Type 2 diabetes mellitus without complications: Secondary | ICD-10-CM | POA: Insufficient documentation

## 2020-04-29 DIAGNOSIS — E1169 Type 2 diabetes mellitus with other specified complication: Secondary | ICD-10-CM | POA: Insufficient documentation

## 2020-04-29 DIAGNOSIS — D509 Iron deficiency anemia, unspecified: Secondary | ICD-10-CM | POA: Insufficient documentation

## 2020-04-29 DIAGNOSIS — E559 Vitamin D deficiency, unspecified: Secondary | ICD-10-CM | POA: Insufficient documentation

## 2020-04-29 NOTE — Progress Notes (Signed)
Opened in error- OV converted to video visit. See other OV note from this day.

## 2020-05-02 ENCOUNTER — Ambulatory Visit (INDEPENDENT_AMBULATORY_CARE_PROVIDER_SITE_OTHER): Payer: 59 | Admitting: Family Medicine

## 2020-05-02 ENCOUNTER — Other Ambulatory Visit: Payer: Self-pay

## 2020-05-02 VITALS — BP 128/79 | HR 98 | Temp 98.1°F | Ht 68.0 in | Wt 223.0 lb

## 2020-05-02 DIAGNOSIS — E559 Vitamin D deficiency, unspecified: Secondary | ICD-10-CM | POA: Diagnosis not present

## 2020-05-02 DIAGNOSIS — E6609 Other obesity due to excess calories: Secondary | ICD-10-CM | POA: Diagnosis not present

## 2020-05-02 DIAGNOSIS — D509 Iron deficiency anemia, unspecified: Secondary | ICD-10-CM

## 2020-05-02 DIAGNOSIS — Z6834 Body mass index (BMI) 34.0-34.9, adult: Secondary | ICD-10-CM | POA: Diagnosis not present

## 2020-05-02 DIAGNOSIS — Z9189 Other specified personal risk factors, not elsewhere classified: Secondary | ICD-10-CM | POA: Diagnosis not present

## 2020-05-02 DIAGNOSIS — E669 Obesity, unspecified: Secondary | ICD-10-CM

## 2020-05-02 MED ORDER — VITAMIN D-3 125 MCG (5000 UT) PO TABS
5000.0000 [IU] | ORAL_TABLET | Freq: Every day | ORAL | 0 refills | Status: DC
Start: 2020-05-02 — End: 2020-06-26

## 2020-05-02 MED ORDER — FERROUS SULFATE 325 (65 FE) MG PO TBEC: 325.0000 mg | DELAYED_RELEASE_TABLET | Freq: Two times a day (BID) | ORAL | 0 refills | Status: DC

## 2020-05-03 ENCOUNTER — Telehealth (INDEPENDENT_AMBULATORY_CARE_PROVIDER_SITE_OTHER): Payer: Self-pay

## 2020-05-03 NOTE — Telephone Encounter (Signed)
Patient called stated she has a couple questions in regards to the meal plan she was given last visit. Patient stated she does not know how to use my chart. Please give patient a call at [phone].

## 2020-05-03 NOTE — Progress Notes (Signed)
Chief Complaint:   OBESITY Patricia Macdonald is here to discuss her progress with her obesity treatment plan along with follow-up of her obesity related diagnoses. Patricia Macdonald is on the Category 1 Plan and states she is following her eating plan approximately 20% of the time. [name] states she is doing 0 minutes 0 times per week.  Today's visit was #: 3 Starting weight: 220 lbs Starting date: 11/03/2019 Today's weight: 223 lbs Today's date: 05/02/2020 Total lbs lost to date: 0 Total lbs lost since last in-office visit: 0  Interim History: Patricia Macdonald returns to the clinic for the 1st time since September, as pt had COVID. She is no longer eating meat and is interested in vegetarian options. She is traveling to [place] and [place] this weekend.  Subjective:   1. Vitamin D deficiency Linna denies nausea, vomiting, and muscle weakness but notes fatigue. She reports back/kidney pain with prescription Vit D 50K IU weekly.  2. Iron deficiency anemia, unspecified iron deficiency anemia type Patricia Macdonald's last Hgb 10.1, Hct 35.4, and MCV 73. She is on an iron supplement.  CBC Latest Ref Rng & Units 11/03/2019 06/22/2018 06/21/2018  WBC 3.4 - 10.8 x10E3/uL 4.5 7.2 8.5  Hemoglobin 11.1 - 15.9 g/dL 10.1(L) 8.4(L) 8.5(L)  Hematocrit 34.0 - 46.6 % 35.4 29.1(L) 29.6(L)  Platelets 150 - 450 x10E3/uL 398 195 188   No results found for: IRON, TIBC, FERRITIN Lab Results  Component Value Date   VITAMINB12 543 11/03/2019    3. At risk for osteoporosis Patricia Macdonald is at higher risk of osteopenia and osteoporosis due to Vitamin D deficiency.   Assessment/Plan:   1. Vitamin D deficiency Low Vitamin D level contributes to fatigue and are associated with obesity, breast, and colon cancer. She agrees to STOP taking  prescription Vitamin D @50 ,000 IU every week and START Vit D3 5,000 IU daily. She will follow-up for routine testing of Vitamin D, at least 2-3 times per year to avoid over-replacement.  -  Cholecalciferol (VITAMIN D-3) 125 MCG (5000 UT) TABS; Take 5,000 Int'l Units by mouth daily.  Dispense: 30 tablet; Refill: 0  2. Iron deficiency anemia, unspecified iron deficiency anemia type Continue current treatment plan. Refill ferrous sulfate 325 mg, as per below.  - ferrous sulfate 325 (65 FE) MG EC tablet; Take 1 tablet (325 mg total) by mouth 2 (two) times daily with a meal.  Dispense: 60 tablet; Refill: 0  3. At risk for osteoporosis Patricia Macdonald was given approximately 15 minutes of osteoporosis prevention counseling today. Patricia Macdonald is at risk for osteopenia and osteoporosis due to her Vitamin D deficiency. She was encouraged to take her Vitamin D and follow her higher calcium diet and increase strengthening exercise to help strengthen her bones and decrease her risk of osteopenia and osteoporosis.  Repetitive spaced learning was employed today to elicit superior memory formation and behavioral change.  4. Class 1 obesity due to excess calories with body mass index (BMI) of 34.0 to 34.9 in adult, unspecified whether serious comorbidity present [place] is currently in the action stage of change. As such, her goal is to continue with weight loss efforts. She has agreed to the Vegetarian Plan.   Exercise goals: No exercise has been prescribed at this time.  Behavioral modification strategies: increasing lean protein intake, increasing vegetables, meal planning and cooking strategies and travel eating strategies.  Patricia Macdonald has agreed to follow-up with our clinic in 2 weeks. She was informed of the importance of frequent follow-up visits to maximize her  success with intensive lifestyle modifications for her multiple health conditions.   Objective:   Blood pressure 128/79, pulse 98, temperature 98.1 F (36.7 C), temperature source Oral, height 5\' 8"  (1.727 m), weight 223 lb (101.2 kg), last menstrual period 04/22/2020, SpO2 99 %, unknown if currently breastfeeding. Body mass index is 33.91  kg/m.  General: Cooperative, alert, well developed, in no acute distress. HEENT: Conjunctivae and lids unremarkable. Cardiovascular: Regular rhythm.  Lungs: Normal work of breathing. Neurologic: No focal deficits.   Lab Results  Component Value Date   CREATININE 0.77 11/03/2019   BUN 10 11/03/2019   NA 137 11/03/2019   K 4.6 11/03/2019   CL 102 11/03/2019   CO2 23 11/03/2019   Lab Results  Component Value Date   ALT 20 11/03/2019   AST 16 11/03/2019   ALKPHOS 106 11/03/2019   BILITOT 0.4 11/03/2019   Lab Results  Component Value Date   HGBA1C 6.5 (H) 11/03/2019   Lab Results  Component Value Date   INSULIN 16.8 11/03/2019   Lab Results  Component Value Date   TSH 1.420 11/03/2019   Lab Results  Component Value Date   CHOL 178 11/03/2019   HDL 42 11/03/2019   LDLCALC 125 (H) 11/03/2019   TRIG 59 11/03/2019   Lab Results  Component Value Date   WBC 4.5 11/03/2019   HGB 10.1 (L) 11/03/2019   HCT 35.4 11/03/2019   MCV 73 (L) 11/03/2019   PLT 398 11/03/2019     Attestation Statements:   Reviewed by clinician on day of visit: allergies, medications, problem list, medical history, surgical history, family history, social history, and previous encounter notes.  01/03/2020, am acting as transcriptionist for Edmund Hilda, MD.   I have reviewed the above documentation for accuracy and completeness, and I agree with the above. - Reuben Likes, MD

## 2020-05-07 NOTE — Telephone Encounter (Signed)
Last seen by Dr. Ukleja. 

## 2020-05-08 NOTE — Telephone Encounter (Signed)
Tried to call pt x 2, Unable to complete call, try again later-CAS

## 2020-05-14 ENCOUNTER — Encounter (INDEPENDENT_AMBULATORY_CARE_PROVIDER_SITE_OTHER): Payer: Self-pay | Admitting: Family Medicine

## 2020-05-14 ENCOUNTER — Other Ambulatory Visit: Payer: Self-pay

## 2020-05-14 ENCOUNTER — Ambulatory Visit (INDEPENDENT_AMBULATORY_CARE_PROVIDER_SITE_OTHER): Payer: 59 | Admitting: Family Medicine

## 2020-05-14 VITALS — BP 115/74 | HR 100 | Temp 98.0°F | Ht 68.0 in | Wt 223.0 lb

## 2020-05-14 DIAGNOSIS — E559 Vitamin D deficiency, unspecified: Secondary | ICD-10-CM | POA: Diagnosis not present

## 2020-05-14 DIAGNOSIS — E669 Obesity, unspecified: Secondary | ICD-10-CM | POA: Diagnosis not present

## 2020-05-14 DIAGNOSIS — E1169 Type 2 diabetes mellitus with other specified complication: Secondary | ICD-10-CM | POA: Diagnosis not present

## 2020-05-14 DIAGNOSIS — Z6834 Body mass index (BMI) 34.0-34.9, adult: Secondary | ICD-10-CM

## 2020-05-14 DIAGNOSIS — E66811 Obesity, class 1: Secondary | ICD-10-CM

## 2020-05-14 DIAGNOSIS — Z9189 Other specified personal risk factors, not elsewhere classified: Secondary | ICD-10-CM | POA: Diagnosis not present

## 2020-05-14 DIAGNOSIS — D509 Iron deficiency anemia, unspecified: Secondary | ICD-10-CM | POA: Diagnosis not present

## 2020-05-14 MED ORDER — FERROUS SULFATE 325 (65 FE) MG PO TBEC: 325.0000 mg | DELAYED_RELEASE_TABLET | Freq: Two times a day (BID) | ORAL | 0 refills | Status: DC

## 2020-05-15 DIAGNOSIS — E119 Type 2 diabetes mellitus without complications: Secondary | ICD-10-CM | POA: Insufficient documentation

## 2020-05-21 NOTE — Progress Notes (Signed)
Chief Complaint:   OBESITY Patricia Macdonald is here to discuss her progress with her obesity treatment plan along with follow-up of her obesity related diagnoses.   Today's visit was #: 4 Starting weight: 220 lbs Starting date: 11/03/2019 Today's weight: 223 lbs Today's date: 05/14/2020 Total lbs lost to date: +3 lbs Body mass index is 33.91 kg/m.   Interim History:  Patricia Macdonald was put on the vegetarian plan by Dr. Lawson Radar at her last visit.  She says it is going well.  She feels full.  No cravings in general, but had 1 day of cravings because she did not follow the plan.  She is drinking 66 ounces of water per day.  No concerns today.  Current Meal Plan: the Vegetarian Plan for 60% of the time.  Current Exercise Plan: None.  Assessment/Plan:   1. Iron deficiency anemia, unspecified iron deficiency anemia type [name] is taking ferrous sulfate 325 mg daily. Restarted iron supplement last office visit.  Denies constipation and is tolerating well.  She has noticed improved energy.  Plan:  Continue ferrous sulfate.  Nutrition: Iron-rich foods include dark leafy greens, red and white meats, eggs, seafood, and beans.  Certain foods and drinks prevent your body from absorbing iron properly. Avoid eating these foods in the same meal as iron-rich foods or with iron supplements. These foods include: coffee, black tea, and red wine; milk, dairy products, and foods that are high in calcium; beans and soybeans; whole grains. Constipation can be a side effect of iron supplementation. Increased water and fiber intake are helpful. Water goal: > 2 liters/day. Fiber goal: > 25 grams/day.  Will recheck labs 2-3 months after consistent replacement.  Will check CBC, ferritin, transferrin, iron, and TIBC.  Education done.  CBC Latest Ref Rng & Units 11/03/2019 06/22/2018 06/21/2018  WBC 3.4 - 10.8 x10E3/uL 4.5 7.2 8.5  Hemoglobin 11.1 - 15.9 g/dL 10.1(L) 8.4(L) 8.5(L)  Hematocrit 34.0 - 46.6 % 35.4 29.1(L) 29.6(L)   Platelets 150 - 450 x10E3/uL 398 195 188   Lab Results  Component Value Date   VITAMINB12 543 11/03/2019   - Refill ferrous sulfate 325 (65 FE) MG EC tablet; Take 1 tablet (325 mg total) by mouth 2 (two) times daily with a meal.  Dispense: 60 tablet; Refill: 0 - CBC with Differential/Platelet - Iron, TIBC and Ferritin Panel - Transferrin  2. Vitamin D deficiency Not at goal. Current vitamin D is 19.7, tested on 11/03/2019. Optimal goal > 50 ng/dL.  She is taking 5,000 IU daily.  This was restarted at her last office visit.  Tolerating well with no side effects.  Plan: Continue current OTC vitamin D supplementation.  Will check vitamin D level in 3 months after consistent use.  Education done.  3. Type 2 diabetes mellitus with other specified complication, without long-term current use of insulin (HCC) Diabetes Mellitus: Not at goal. Medication: None. Issues reviewed: blood sugar goals, complications of diabetes mellitus, hypoglycemia prevention and treatment, exercise, and nutrition.  Not checking blood sugars at home.  New diagnosed back in September by [place].  She has not followed up with her PCP yet, which I recommended last time I saw her.  Asymptomatic.  No concerns.  Plan: The importance of regular follow up with PCP and all other specialists as scheduled was stressed to patient today.  Will check labs at next visit as the labs is closed today.    Lab Results  Component Value Date   HGBA1C 6.5 (H) 11/03/2019  Lab Results  Component Value Date   LDLCALC 125 (H) 11/03/2019   CREATININE 0.77 11/03/2019   - Hemoglobin A1c  4. At risk for dehydration Patricia Macdonald is at higher than average risk of dehydration.  Patricia Macdonald was given more than 9 minutes of proper hydration counseling today.  We discussed the signs and symptoms of dehydration, some of which may include muscle cramping, constipation or even orthostatic symptoms.  Counseling on the prevention of dehydration was also provided  today.  Patricia Macdonald is at risk for dehydration due to weight loss, lifestyle and behavorial habits and possibly due to taking certain medication(s).  She was encouraged to adequately hydrate and monitor fluid status to avoid dehydration as well as weight loss plateaus.  Unless pre-existing renal or cardiopulmonary conditions exist, in which patient was told to limit their fluid intake, I recommended roughly one half of their weight in pounds to be the approximate ounces of non-caloric, non-caffeinated beverages they should drink per day; including more if they are engaging in exercise.  5. Class 1 obesity with serious comorbidity and body mass index (BMI) of 34.0 to 34.9 in adult, unspecified obesity type  Course: Patricia Macdonald is currently in the action stage of change. As such, her goal is to continue with weight loss efforts.   Nutrition goals: She has agreed to the Vegetarian Plan.   Exercise goals: As is.  Behavioral modification strategies: increasing lean protein intake, decreasing simple carbohydrates, meal planning and cooking strategies, keeping healthy foods in the home and planning for success.  Patricia Macdonald has agreed to follow-up with our clinic in 2-3 weeks. She was informed of the importance of frequent follow-up visits to maximize her success with intensive lifestyle modifications for her multiple health conditions.   Patricia Macdonald was informed we would discuss her lab results at her next visit unless there is a critical issue that needs to be addressed sooner. Patricia Macdonald agreed to keep her next visit at the agreed upon time to discuss these results.  Objective:   Blood pressure 115/74, pulse 100, temperature 98 F (36.7 C), height 5\' 8"  (1.727 m), weight 223 lb (101.2 kg), last menstrual period 04/22/2020, SpO2 99 %, unknown if currently breastfeeding. Body mass index is 33.91 kg/m.  General: Cooperative, alert, well developed, in no acute distress. HEENT: Conjunctivae and lids  unremarkable. Cardiovascular: Regular rhythm.  Lungs: Normal work of breathing. Neurologic: No focal deficits.   Lab Results  Component Value Date   CREATININE 0.77 11/03/2019   BUN 10 11/03/2019   NA 137 11/03/2019   K 4.6 11/03/2019   CL 102 11/03/2019   CO2 23 11/03/2019   Lab Results  Component Value Date   ALT 20 11/03/2019   AST 16 11/03/2019   ALKPHOS 106 11/03/2019   BILITOT 0.4 11/03/2019   Lab Results  Component Value Date   HGBA1C 6.5 (H) 11/03/2019   Lab Results  Component Value Date   INSULIN 16.8 11/03/2019   Lab Results  Component Value Date   TSH 1.420 11/03/2019   Lab Results  Component Value Date   CHOL 178 11/03/2019   HDL 42 11/03/2019   LDLCALC 125 (H) 11/03/2019   TRIG 59 11/03/2019   Lab Results  Component Value Date   WBC 4.5 11/03/2019   HGB 10.1 (L) 11/03/2019   HCT 35.4 11/03/2019   MCV 73 (L) 11/03/2019   PLT 398 11/03/2019   Attestation Statements:   Reviewed by clinician on day of visit: allergies, medications, problem list, medical history, surgical  history, family history, social history, and previous encounter notes.  I, Insurance claims handler, CMA, am acting as Energy manager for [employer], DO.  I have reviewed the above documentation for accuracy and completeness, and I agree with the above. Carlye Grippe, D.O.  The 21st Century Cures Act was signed into law in 2016 which includes the topic of electronic health records.  This provides immediate access to information in MyChart.  This includes consultation notes, operative notes, office notes, lab results and pathology reports.  If you have any questions about what you read please let us know at your next visit so we can discuss your concerns and take corrective action if need be.  We are right here with you.  Orders Placed This Encounter  Procedures  . CBC with Differential/Platelet  . Hemoglobin A1c  . Iron, TIBC and Ferritin Panel  . Transferrin    Medications  Discontinued During This Encounter  Medication Reason  . ferrous sulfate 325 (65 FE) MG EC tablet Reorder     Meds ordered this encounter  Medications  . ferrous sulfate 325 (65 FE) MG EC tablet    Sig: Take 1 tablet (325 mg total) by mouth 2 (two) times daily with a meal.    Dispense:  60 tablet    Refill:  0

## 2020-05-22 ENCOUNTER — Other Ambulatory Visit (HOSPITAL_COMMUNITY): Payer: Self-pay | Admitting: Gastroenterology

## 2020-05-22 ENCOUNTER — Other Ambulatory Visit: Payer: Self-pay | Admitting: Gastroenterology

## 2020-05-22 DIAGNOSIS — R1011 Right upper quadrant pain: Secondary | ICD-10-CM

## 2020-06-06 ENCOUNTER — Ambulatory Visit (INDEPENDENT_AMBULATORY_CARE_PROVIDER_SITE_OTHER): Payer: 59 | Admitting: Family Medicine

## 2020-06-12 ENCOUNTER — Ambulatory Visit (HOSPITAL_COMMUNITY): Payer: 59

## 2020-06-12 ENCOUNTER — Encounter (HOSPITAL_COMMUNITY): Payer: Self-pay

## 2020-06-12 ENCOUNTER — Encounter (HOSPITAL_COMMUNITY)
Admission: RE | Admit: 2020-06-12 | Discharge: 2020-06-12 | Disposition: A | Discharge status: Home / Self Care | Payer: 59 | Source: Ambulatory Visit | Attending: Gastroenterology | Admitting: Gastroenterology

## 2020-06-12 ENCOUNTER — Other Ambulatory Visit: Payer: Self-pay

## 2020-06-12 ENCOUNTER — Ambulatory Visit (HOSPITAL_COMMUNITY)
Admission: RE | Admit: 2020-06-12 | Discharge: 2020-06-12 | Disposition: A | Discharge status: Home / Self Care | Payer: 59 | Source: Ambulatory Visit | Attending: Gastroenterology | Admitting: Gastroenterology

## 2020-06-12 DIAGNOSIS — R1011 Right upper quadrant pain: Secondary | ICD-10-CM | POA: Insufficient documentation

## 2020-06-12 DIAGNOSIS — K760 Fatty (change of) liver, not elsewhere classified: Secondary | ICD-10-CM | POA: Diagnosis not present

## 2020-06-14 ENCOUNTER — Telehealth (INDEPENDENT_AMBULATORY_CARE_PROVIDER_SITE_OTHER): Payer: Self-pay | Admitting: Family Medicine

## 2020-06-14 NOTE — Telephone Encounter (Signed)
Patient is having some gastro issues and want to know if meal plan she is on a low fat plan. She was having issues with mychart and was not able to send message. Last seen by Dr. Sharee Holster.

## 2020-06-14 NOTE — Telephone Encounter (Signed)
Yes, she is on the correct plan. We can discuss more in depth at her next visit.

## 2020-06-14 NOTE — Telephone Encounter (Signed)
Please Advise:   Call to patient to get more information.  Pt states that she was seen by her PCP and was told that she has a fatty liver and has to have her gallbladder checked.  Pt has not started any new medications.  Pt states that she was told that she needed to be following a low fat plan.  Pt was given a vegetarian plan at her last visit, because she does not like to eat meat all of the time, but has recently been following the Catogory 1 meal plan.  Pt just wants to make sure she has the correct plan for herself.  Pt has an appointment on 06/26/20, but would like someone to answer her sooner.

## 2020-06-18 NOTE — Telephone Encounter (Signed)
Meal plan sent to patient

## 2020-06-26 ENCOUNTER — Other Ambulatory Visit: Payer: Self-pay

## 2020-06-26 ENCOUNTER — Ambulatory Visit (INDEPENDENT_AMBULATORY_CARE_PROVIDER_SITE_OTHER): Payer: 59 | Admitting: Family Medicine

## 2020-06-26 ENCOUNTER — Encounter (INDEPENDENT_AMBULATORY_CARE_PROVIDER_SITE_OTHER): Payer: Self-pay | Admitting: Family Medicine

## 2020-06-26 VITALS — BP 138/79 | HR 80 | Temp 98.1°F | Ht 68.0 in | Wt 226.0 lb

## 2020-06-26 DIAGNOSIS — Z9189 Other specified personal risk factors, not elsewhere classified: Secondary | ICD-10-CM | POA: Diagnosis not present

## 2020-06-26 DIAGNOSIS — Z6833 Body mass index (BMI) 33.0-33.9, adult: Secondary | ICD-10-CM

## 2020-06-26 DIAGNOSIS — E1169 Type 2 diabetes mellitus with other specified complication: Secondary | ICD-10-CM

## 2020-06-26 DIAGNOSIS — E559 Vitamin D deficiency, unspecified: Secondary | ICD-10-CM | POA: Diagnosis not present

## 2020-06-26 DIAGNOSIS — E669 Obesity, unspecified: Secondary | ICD-10-CM

## 2020-06-26 DIAGNOSIS — K760 Fatty (change of) liver, not elsewhere classified: Secondary | ICD-10-CM

## 2020-06-26 DIAGNOSIS — D509 Iron deficiency anemia, unspecified: Secondary | ICD-10-CM | POA: Diagnosis not present

## 2020-06-26 MED ORDER — VITAMIN D-3 125 MCG (5000 UT) PO TABS: 5000.0000 [IU] | ORAL_TABLET | Freq: Every day | ORAL | 0 refills | Status: DC

## 2020-06-26 MED ORDER — FERROUS SULFATE 325 (65 FE) MG PO TBEC: 325.0000 mg | DELAYED_RELEASE_TABLET | Freq: Two times a day (BID) | ORAL | 0 refills | Status: AC

## 2020-07-04 NOTE — Progress Notes (Signed)
Chief Complaint:   OBESITY Pranathi is here to discuss her progress with her obesity treatment plan along with follow-up of her obesity related diagnoses.   Today's visit was #: 5 Starting weight: 220 lbs Starting date: 11/03/2019 Today's weight: 226 lbs Today's date: 06/26/2020 Total lbs lost to date: +6 lbs Body mass index is 34.36 kg/m.   Interim History:  CHARI PARMENTER is here for a follow up office visit and she is following the meal plan without concerns or issues.  Patient's meal and food recall appears to be accurate and consistent with what is on the plan.  When on plan, her hunger and cravings are well controlled.    Clarisse says she frequently misses foods/meals.  She likes the meal plan.  Current Meal Plan: the Vegetarian Plan for 60% of the time.  Current Exercise Plan: None.  Assessment/Plan:    Medications Discontinued During This Encounter  Medication Reason  . acetaminophen (TYLENOL) 500 MG tablet   . ibuprofen (ADVIL) 600 MG tablet   . Cholecalciferol (VITAMIN D-3) 125 MCG (5000 UT) TABS Reorder  . ferrous sulfate 325 (65 FE) MG EC tablet Reorder     Meds ordered this encounter  Medications  . ferrous sulfate 325 (65 FE) MG EC tablet    Sig: Take 1 tablet (325 mg total) by mouth 2 (two) times daily with a meal.    Dispense:  60 tablet    Refill:  0  . Cholecalciferol (VITAMIN D-3) 125 MCG (5000 UT) TABS    Sig: Take 5,000 Int'l Units by mouth daily.    Dispense:  30 tablet    Refill:  0       1. Vitamin D deficiency Not at goal. Current vitamin D is 19.7, tested on 11/03/2019. Optimal goal > 50 ng/dL.  She is taking vitamin D 5,000 IU daily.  She did not tolerate once weekly dosing.  Plan: Continue current OTC vitamin D supplementation.  Will check vitamin D level at next office visit.  - Refill Cholecalciferol (VITAMIN D-3) 125 MCG (5000 UT) TABS; Take 5,000 Int'l Units by mouth daily.  Dispense: 30 tablet; Refill: 0 - VITAMIN D 25 Hydroxy  (Vit-D Deficiency, Fractures)    2. Iron deficiency anemia, unspecified iron deficiency anemia type Candy is taking ferrous sulfate 325 mg twice daily. Tolerating well with no GI side effects.  Plan:  Check transferrin, TIBC, ferritin, iron level, and CBC at next office visit.  Will refill ferrous sulfate 325 mg twice daily.  Nutrition: Iron-rich foods include dark leafy greens, red and white meats, eggs, seafood, and beans.  Certain foods and drinks prevent your body from absorbing iron properly. Avoid eating these foods in the same meal as iron-rich foods or with iron supplements. These foods include: coffee, black tea, and red wine; milk, dairy products, and foods that are high in calcium; beans and soybeans; whole grains. Constipation can be a side effect of iron supplementation. Increased water and fiber intake are helpful. Water goal: > 2 liters/day. Fiber goal: > 25 grams/day.  CBC Latest Ref Rng & Units 11/03/2019 06/22/2018 06/21/2018  WBC 3.4 - 10.8 x10E3/uL 4.5 7.2 8.5  Hemoglobin 11.1 - 15.9 g/dL 10.1(L) 8.4(L) 8.5(L)  Hematocrit 34.0 - 46.6 % 35.4 29.1(L) 29.6(L)  Platelets 150 - 450 x10E3/uL 398 195 188   No results found for: IRON, TIBC, FERRITIN Lab Results  Component Value Date   VITAMINB12 543 11/03/2019   - Refill ferrous sulfate 325 (65  FE) MG EC tablet; Take 1 tablet (325 mg total) by mouth 2 (two) times daily with a meal.  Dispense: 60 tablet; Refill: 0    3. Type 2 diabetes mellitus with other specified complication, without long-term current use of insulin (HCC) Diabetes Mellitus: Not at goal. Medication: None. Issues reviewed: blood sugar goals, complications of diabetes mellitus, hypoglycemia prevention and treatment, exercise, and nutrition.  She is not checking her blood sugar.  Plan:  Will check A1c at next office visit.  The importance of regular follow up with PCP and all other specialists as scheduled was stressed to patient today.  Lab Results   Component Value Date   HGBA1C 6.5 (H) 11/03/2019   Lab Results  Component Value Date   LDLCALC 125 (H) 11/03/2019   CREATININE 0.77 11/03/2019     4. NAFLD (nonalcoholic fatty liver disease) Positive findings on ultrasound.  Has follow-up scheduled with GI.  Plan:  prudent nutritional plan, weight loss.  Follow-up with GI (Dr. Charna Elizabeth).  NAFLD is an umbrella term that encompasses a disease spectrum that includes steatosis (fat) without inflammation, steatohepatitis (NASH; fat + inflammation in a characteristic pattern), and cirrhosis. Bland steatosis is felt to be a benign condition, with extremely low to no risk of progression to cirrhosis, whereas NASH can progress to cirrhosis. The mainstay of treatment of NAFLD includes lifestyle modification to achieve weight loss, at least 7% of current body weight. Low carbohydrate diets can be beneficial in improving NAFLD liver histology. Additionally, exercise, even the absence of weight loss can have beneficial effects on the patient's metabolic profile and liver health.     5. At risk for hyperglycemia Nakeitha was given approximately 9 minutes of counseling today regarding detriments to health with hyperglycemia.  She was advised of symptoms of hyperglycemia.  Gissele was instructed to avoid skipping proteins in meals and eating only carb-rich foods.  The patient should be checking FBS, 2 hr PP BS's and also any time they do not feel well- especially with new onset nausea/ vomiting and abdominal pain, excessive thirst or hunger, tachycardia etc.  Importance of regular follow-ups with their PCP or Endocrinologist, in addition to our visits was stressed to patient.    6. Obesity, current BMI 34.5  Course: Edit is currently in the action stage of change. As such, her goal is to continue with weight loss efforts.   Nutrition goals: She has agreed to the Vegetarian Plan.   Exercise goals: All adults should avoid inactivity. Some  physical activity is better than none, and adults who participate in any amount of physical activity gain some health benefits.  Behavioral modification strategies: increasing lean protein intake, decreasing simple carbohydrates, meal planning and cooking strategies and planning for success.  Natalynn has agreed to follow-up with our clinic in 2-3 weeks. She was informed of the importance of frequent follow-up visits to maximize her success with intensive lifestyle modifications for her multiple health conditions.     Objective:   Blood pressure 138/79, pulse 80, temperature 98.1 F (36.7 C), height 5\' 8"  (1.727 m), weight 226 lb (102.5 kg), SpO2 100 %, unknown if currently breastfeeding. Body mass index is 34.36 kg/m.  General: Cooperative, alert, well developed, in no acute distress. HEENT: Conjunctivae and lids unremarkable. Cardiovascular: Regular rhythm.  Lungs: Normal work of breathing. Neurologic: No focal deficits.   Lab Results  Component Value Date   CREATININE 0.77 11/03/2019   BUN 10 11/03/2019   NA 137 11/03/2019   K 4.6  11/03/2019   CL 102 11/03/2019   CO2 23 11/03/2019   Lab Results  Component Value Date   ALT 20 11/03/2019   AST 16 11/03/2019   ALKPHOS 106 11/03/2019   BILITOT 0.4 11/03/2019   Lab Results  Component Value Date   HGBA1C 6.5 (H) 11/03/2019   Lab Results  Component Value Date   INSULIN 16.8 11/03/2019   Lab Results  Component Value Date   TSH 1.420 11/03/2019   Lab Results  Component Value Date   CHOL 178 11/03/2019   HDL 42 11/03/2019   LDLCALC 125 (H) 11/03/2019   TRIG 59 11/03/2019   Lab Results  Component Value Date   WBC 4.5 11/03/2019   HGB 10.1 (L) 11/03/2019   HCT 35.4 11/03/2019   MCV 73 (L) 11/03/2019   PLT 398 11/03/2019   Attestation Statements:   Reviewed by clinician on day of visit: allergies, medications, problem list, medical history, surgical history, family history, social history, and previous  encounter notes.  I, Insurance claims handler, CMA, am acting as Energy manager for [employer], DO.  I have reviewed the above documentation for accuracy and completeness, and I agree with the above. Carlye Grippe, D.O.  The 21st Century Cures Act was signed into law in 2016 which includes the topic of electronic health records.  This provides immediate access to information in MyChart.  This includes consultation notes, operative notes, office notes, lab results and pathology reports.  If you have any questions about what you read please let us know at your next visit so we can discuss your concerns and take corrective action if need be.  We are right here with you.

## 2020-07-16 ENCOUNTER — Other Ambulatory Visit: Payer: Self-pay

## 2020-07-16 ENCOUNTER — Encounter (INDEPENDENT_AMBULATORY_CARE_PROVIDER_SITE_OTHER): Payer: Self-pay | Admitting: Family Medicine

## 2020-07-16 ENCOUNTER — Ambulatory Visit (INDEPENDENT_AMBULATORY_CARE_PROVIDER_SITE_OTHER): Payer: 59 | Admitting: Family Medicine

## 2020-07-16 VITALS — BP 120/80 | HR 72 | Temp 98.1°F | Ht 68.0 in | Wt 224.0 lb

## 2020-07-16 DIAGNOSIS — D509 Iron deficiency anemia, unspecified: Secondary | ICD-10-CM | POA: Diagnosis not present

## 2020-07-16 DIAGNOSIS — E1169 Type 2 diabetes mellitus with other specified complication: Secondary | ICD-10-CM | POA: Diagnosis not present

## 2020-07-16 DIAGNOSIS — Z9189 Other specified personal risk factors, not elsewhere classified: Secondary | ICD-10-CM

## 2020-07-16 DIAGNOSIS — Z6833 Body mass index (BMI) 33.0-33.9, adult: Secondary | ICD-10-CM | POA: Diagnosis not present

## 2020-07-16 DIAGNOSIS — E559 Vitamin D deficiency, unspecified: Secondary | ICD-10-CM

## 2020-07-16 DIAGNOSIS — E669 Obesity, unspecified: Secondary | ICD-10-CM

## 2020-07-16 MED ORDER — VITAMIN D-3 125 MCG (5000 UT) PO TABS: 5000.0000 [IU] | ORAL_TABLET | Freq: Every day | ORAL | 0 refills | Status: AC

## 2020-07-17 LAB — IRON,TIBC AND FERRITIN PANEL
Ferritin: 6 ng/mL — ABNORMAL LOW (ref 15–150)
Iron Saturation: 4 % — CL (ref 15–55)
Iron: 18 ug/dL — ABNORMAL LOW (ref 27–159)
Total Iron Binding Capacity: 407 ug/dL (ref 250–450)
UIBC: 389 ug/dL (ref 131–425)

## 2020-07-17 LAB — CBC WITH DIFFERENTIAL/PLATELET
Basophils Absolute: 0 10*3/uL (ref 0.0–0.2)
Basos: 1 %
EOS (ABSOLUTE): 0.2 10*3/uL (ref 0.0–0.4)
Eos: 4 %
Hematocrit: 33.3 % — ABNORMAL LOW (ref 34.0–46.6)
Hemoglobin: 9.8 g/dL — ABNORMAL LOW (ref 11.1–15.9)
Immature Grans (Abs): 0 10*3/uL (ref 0.0–0.1)
Immature Granulocytes: 0 %
Lymphocytes Absolute: 1.6 10*3/uL (ref 0.7–3.1)
Lymphs: 37 %
MCH: 20.8 pg — ABNORMAL LOW (ref 26.6–33.0)
MCHC: 29.4 g/dL — ABNORMAL LOW (ref 31.5–35.7)
MCV: 71 fL — ABNORMAL LOW (ref 79–97)
Monocytes Absolute: 0.4 10*3/uL (ref 0.1–0.9)
Monocytes: 10 %
Neutrophils Absolute: 2.1 10*3/uL (ref 1.4–7.0)
Neutrophils: 48 %
Platelets: 371 10*3/uL (ref 150–450)
RBC: 4.72 x10E6/uL (ref 3.77–5.28)
RDW: 17.6 % — ABNORMAL HIGH (ref 11.7–15.4)
WBC: 4.4 10*3/uL (ref 3.4–10.8)

## 2020-07-17 LAB — TRANSFERRIN: Transferrin: 364 mg/dL (ref 192–364)

## 2020-07-17 LAB — HEMOGLOBIN A1C
Est. average glucose Bld gHb Est-mCnc: 131 mg/dL
Hgb A1c MFr Bld: 6.2 % — ABNORMAL HIGH (ref 4.8–5.6)

## 2020-07-17 LAB — VITAMIN D 25 HYDROXY (VIT D DEFICIENCY, FRACTURES): Vit D, 25-Hydroxy: 31.4 ng/mL (ref 30.0–100.0)

## 2020-07-19 NOTE — Progress Notes (Signed)
Chief Complaint:   OBESITY Patricia Macdonald is here to discuss her progress with her obesity treatment plan along with follow-up of her obesity related diagnoses.   Today's visit was #: 6 Starting weight: 220 lbs Starting date: 11/03/2019 Today's weight: 224 lbs Today's date: 07/16/2020 Weight change since last visit: 2 lbs Total lbs lost to date: +4 lbs Body mass index is 34.06 kg/m.   Interim History:  Patricia Macdonald says she is struggling to remember to take her supplements and medications.  Meal plan is going well but she wants to eat meat now and would like to go to the original meal plan.  Current Meal Plan: the Vegetarian Plan for 75% of the time.  Current Exercise Plan: Extreme hip hop class for 60 minutes 2 times per week.  Assessment/Plan:   Medications Discontinued During This Encounter  Medication Reason  . Cholecalciferol (VITAMIN D-3) 125 MCG (5000 UT) TABS Reorder     Meds ordered this encounter  Medications  . Cholecalciferol (VITAMIN D-3) 125 MCG (5000 UT) TABS    Sig: Take 5,000 Int'l Units by mouth daily.    Dispense:  30 tablet    Refill:  0    1. Vitamin D deficiency Not at goal. Current vitamin D is 19.7, tested on 11/03/2019. Optimal goal > 50 ng/dL.  She is taking vitamin D 5,000 IU daily.  She says she takes her supplement 40% of the time.  Plan: Continue current OTC vitamin D supplementation.  Check vitamin D level today.  Take medications as prescribed.  Importance of medication compliance discussed.  - Refill Cholecalciferol (VITAMIN D-3) 125 MCG (5000 UT) TABS; Take 5,000 Int'l Units by mouth daily.  Dispense: 30 tablet; Refill: 0  2. Iron deficiency anemia, unspecified iron deficiency anemia type She says she remembers to take her supplement 50% of the time.  Plan:  Check iron panel and CBC today.  Medication compliance discussed with patient.   Nutrition: Iron-rich foods include dark leafy greens, red and white meats, eggs, seafood, and beans.   Certain foods and drinks prevent your body from absorbing iron properly. Avoid eating these foods in the same meal as iron-rich foods or with iron supplements. These foods include: coffee, black tea, and red wine; milk, dairy products, and foods that are high in calcium; beans and soybeans; whole grains. Constipation can be a side effect of iron supplementation. Increased water and fiber intake are helpful. Water goal: > 2 liters/day. Fiber goal: > 25 grams/day.  CBC Latest Ref Rng & Units 07/16/2020 11/03/2019 06/22/2018  WBC 3.4 - 10.8 x10E3/uL 4.4 4.5 7.2  Hemoglobin 11.1 - 15.9 g/dL 2.2(L) 10.1(L) 8.4(L)  Hematocrit 34.0 - 46.6 % 33.3(L) 35.4 29.1(L)  Platelets 150 - 450 x10E3/uL 371 398 195   Lab Results  Component Value Date   IRON 18 (L) 07/16/2020   TIBC 407 07/16/2020   FERRITIN 6 (L) 07/16/2020   Lab Results  Component Value Date   VITAMINB12 543 11/03/2019   3. Type 2 diabetes mellitus with other specified complication, without long-term current use of insulin (HCC) Diabetes Mellitus: Not at goal. Medication: None. Issues reviewed: blood sugar goals, complications of diabetes mellitus, hypoglycemia prevention and treatment, exercise, and nutrition.  A1c when last checked was 6.5 or greater.  No medications because she has always declined them.  Plan:  Recheck A1c today.  Recommend that she follows up with her PCP with this latest information (since she still has not done so).  Lab Results  Component Value Date   HGBA1C 6.2 (H) 07/16/2020   HGBA1C 6.5 (H) 11/03/2019   Lab Results  Component Value Date   LDLCALC 125 (H) 11/03/2019   CREATININE 0.77 11/03/2019   4. At risk for medication nonadherence Due to Patricia Macdonald's medications and lifestyle habits, she is at a significantly higher risk for medication non-adherence.   she was given 10 minutes of counseling regarding the risks associated with noncompliant behavior with our treatment plan.  Pt denies barriers of emotional  distress, lack of proper education, or financial barriers.  Reviewed risks associated with these disease processes and that if they remain poorly controlled, can negatively affect patients quality of life and possibly lead to poor health outcomes.   5. Obesity, current BMI 34.1 Course: Patricia Macdonald is currently in the action stage of change. As such, her goal is to continue with weight loss efforts.   Nutrition goals: She has agreed to the Category 2 Plan or the Vegetarian Plan.   Exercise goals: For substantial health benefits, adults should do at least 150 minutes (2 hours and 30 minutes) a week of moderate-intensity, or 75 minutes (1 hour and 15 minutes) a week of vigorous-intensity aerobic physical activity, or an equivalent combination of moderate- and vigorous-intensity aerobic activity. Aerobic activity should be performed in episodes of at least 10 minutes, and preferably, it should be spread throughout the week.  Behavioral modification strategies: increasing lean protein intake, decreasing simple carbohydrates, decreasing eating out and meal planning and cooking strategies.  Patricia Macdonald has agreed to follow-up with our clinic in 2-3 weeks to discuss labs results as well. She was informed of the importance of frequent follow-up visits to maximize her success with intensive lifestyle modifications for her multiple health conditions.   Patricia Macdonald was informed we would discuss her lab results at her next visit unless there is a critical issue that needs to be addressed sooner. Patricia Macdonald agreed to keep her next visit at the agreed upon time to discuss these results.  Objective:   Blood pressure 120/80, pulse 72, temperature 98.1 F (36.7 C), height 5\' 8"  (1.727 m), weight 224 lb (101.6 kg), SpO2 100 %, unknown if currently breastfeeding. Body mass index is 34.06 kg/m.  General: Cooperative, alert, well developed, in no acute distress. HEENT: Conjunctivae and lids unremarkable. Cardiovascular:  Regular rhythm.  Lungs: Normal work of breathing. Neurologic: No focal deficits.   Lab Results  Component Value Date   CREATININE 0.77 11/03/2019   BUN 10 11/03/2019   NA 137 11/03/2019   K 4.6 11/03/2019   CL 102 11/03/2019   CO2 23 11/03/2019   Lab Results  Component Value Date   ALT 20 11/03/2019   AST 16 11/03/2019   ALKPHOS 106 11/03/2019   BILITOT 0.4 11/03/2019   Lab Results  Component Value Date   HGBA1C 6.2 (H) 07/16/2020   HGBA1C 6.5 (H) 11/03/2019   Lab Results  Component Value Date   INSULIN 16.8 11/03/2019   Lab Results  Component Value Date   TSH 1.420 11/03/2019   Lab Results  Component Value Date   CHOL 178 11/03/2019   HDL 42 11/03/2019   LDLCALC 125 (H) 11/03/2019   TRIG 59 11/03/2019   Lab Results  Component Value Date   WBC 4.4 07/16/2020   HGB 9.8 (L) 07/16/2020   HCT 33.3 (L) 07/16/2020   MCV 71 (L) 07/16/2020   PLT 371 07/16/2020   Lab Results  Component Value Date   IRON 18 (L) 07/16/2020  TIBC 407 07/16/2020   FERRITIN 6 (L) 07/16/2020   Attestation Statements:   Reviewed by clinician on day of visit: allergies, medications, problem list, medical history, surgical history, family history, social history, and previous encounter notes.  I, Insurance claims handler, CMA, am acting as Energy manager for [employer], DO.  I have reviewed the above documentation for accuracy and completeness, and I agree with the above. Carlye Grippe, D.O.  The 21st Century Cures Act was signed into law in 2016 which includes the topic of electronic health records.  This provides immediate access to information in MyChart.  This includes consultation notes, operative notes, office notes, lab results and pathology reports.  If you have any questions about what you read please let us know at your next visit so we can discuss your concerns and take corrective action if need be.  We are right here with you.

## 2020-08-07 ENCOUNTER — Encounter (INDEPENDENT_AMBULATORY_CARE_PROVIDER_SITE_OTHER): Payer: Self-pay

## 2020-08-07 ENCOUNTER — Ambulatory Visit (INDEPENDENT_AMBULATORY_CARE_PROVIDER_SITE_OTHER): Payer: 59 | Admitting: Family Medicine

## 2020-08-09 DIAGNOSIS — Q759 Congenital malformation of skull and face bones, unspecified: Secondary | ICD-10-CM | POA: Diagnosis not present

## 2020-08-21 DIAGNOSIS — Z0000 Encounter for general adult medical examination without abnormal findings: Secondary | ICD-10-CM | POA: Diagnosis not present

## 2020-08-21 DIAGNOSIS — R7303 Prediabetes: Secondary | ICD-10-CM | POA: Diagnosis not present

## 2020-10-12 ENCOUNTER — Emergency Department (HOSPITAL_BASED_OUTPATIENT_CLINIC_OR_DEPARTMENT_OTHER): Payer: 59

## 2020-10-12 ENCOUNTER — Encounter (HOSPITAL_BASED_OUTPATIENT_CLINIC_OR_DEPARTMENT_OTHER): Payer: Self-pay | Admitting: Urology

## 2020-10-12 DIAGNOSIS — E119 Type 2 diabetes mellitus without complications: Secondary | ICD-10-CM | POA: Insufficient documentation

## 2020-10-12 DIAGNOSIS — Z91040 Latex allergy status: Secondary | ICD-10-CM | POA: Diagnosis not present

## 2020-10-12 DIAGNOSIS — R2241 Localized swelling, mass and lump, right lower limb: Secondary | ICD-10-CM | POA: Diagnosis not present

## 2020-10-12 DIAGNOSIS — R0789 Other chest pain: Secondary | ICD-10-CM | POA: Diagnosis not present

## 2020-10-12 DIAGNOSIS — R202 Paresthesia of skin: Secondary | ICD-10-CM | POA: Diagnosis not present

## 2020-10-12 DIAGNOSIS — R079 Chest pain, unspecified: Secondary | ICD-10-CM | POA: Diagnosis not present

## 2020-10-12 DIAGNOSIS — R200 Anesthesia of skin: Secondary | ICD-10-CM | POA: Diagnosis not present

## 2020-10-12 DIAGNOSIS — M546 Pain in thoracic spine: Secondary | ICD-10-CM | POA: Diagnosis not present

## 2020-10-12 LAB — BASIC METABOLIC PANEL
Anion gap: 7 (ref 5–15)
BUN: 10 mg/dL (ref 6–20)
CO2: 26 mmol/L (ref 22–32)
Calcium: 9 mg/dL (ref 8.9–10.3)
Chloride: 104 mmol/L (ref 98–111)
Creatinine, Ser: 0.77 mg/dL (ref 0.44–1.00)
GFR, Estimated: >60 mL/min (ref >60)
Glucose, Bld: 100 mg/dL — ABNORMAL HIGH (ref 70–99)
Potassium: 3.5 mmol/L (ref 3.5–5.1)
Sodium: 137 mmol/L (ref 135–145)

## 2020-10-12 LAB — TROPONIN I (HIGH SENSITIVITY): Troponin I (High Sensitivity): <2 ng/L (ref <18)

## 2020-10-12 LAB — PREGNANCY, URINE: Preg Test, Ur: NEGATIVE

## 2020-10-12 NOTE — ED Triage Notes (Signed)
Chest pain x 1 hr ,mid chest, No SOB noted. States right hand numbness.  States " felt like I was gunna pass out".

## 2020-10-13 ENCOUNTER — Emergency Department (HOSPITAL_BASED_OUTPATIENT_CLINIC_OR_DEPARTMENT_OTHER)
Admission: EM | Admit: 2020-10-13 | Discharge: 2020-10-13 | Disposition: A | Discharge status: Home / Self Care | Payer: 59 | Attending: Emergency Medicine | Admitting: Emergency Medicine

## 2020-10-13 DIAGNOSIS — R079 Chest pain, unspecified: Secondary | ICD-10-CM

## 2020-10-13 DIAGNOSIS — R0789 Other chest pain: Secondary | ICD-10-CM | POA: Diagnosis not present

## 2020-10-13 LAB — CBC WITH DIFFERENTIAL/PLATELET
Abs Immature Granulocytes: 0.01 10*3/uL (ref 0.00–0.07)
Basophils Absolute: 0.1 10*3/uL (ref 0.0–0.1)
Basophils Relative: 1 %
Eosinophils Absolute: 0.2 10*3/uL (ref 0.0–0.5)
Eosinophils Relative: 3 %
HCT: 31.9 % — ABNORMAL LOW (ref 36.0–46.0)
Hemoglobin: 9.4 g/dL — ABNORMAL LOW (ref 12.0–15.0)
Immature Granulocytes: 0 %
Lymphocytes Relative: 39 %
Lymphs Abs: 2.3 10*3/uL (ref 0.7–4.0)
MCH: 20.1 pg — ABNORMAL LOW (ref 26.0–34.0)
MCHC: 29.5 g/dL — ABNORMAL LOW (ref 30.0–36.0)
MCV: 68.3 fL — ABNORMAL LOW (ref 80.0–100.0)
Monocytes Absolute: 0.4 10*3/uL (ref 0.1–1.0)
Monocytes Relative: 6 %
Neutro Abs: 3 10*3/uL (ref 1.7–7.7)
Neutrophils Relative %: 51 %
Platelets: 373 10*3/uL (ref 150–400)
RBC: 4.67 MIL/uL (ref 3.87–5.11)
RDW: 19.2 % — ABNORMAL HIGH (ref 11.5–15.5)
WBC: 5.9 10*3/uL (ref 4.0–10.5)
nRBC: 0 % (ref 0.0–0.2)

## 2020-10-13 LAB — D-DIMER, QUANTITATIVE: D-Dimer, Quant: 0.4 ug/mL-FEU (ref 0.00–0.50)

## 2020-10-13 LAB — TROPONIN I (HIGH SENSITIVITY): Troponin I (High Sensitivity): <2 ng/L (ref <18)

## 2020-10-13 NOTE — ED Provider Notes (Signed)
[facility] EMERGENCY DEPARTMENT Provider Note   CSN: [id number] Arrival date & time: 10/12/20  2030     History Chief Complaint  Patient presents with   Chest Pain    Patricia Macdonald is a 35 y.o. female.  The history is provided by the patient.  Chest Pain Patricia Macdonald is a 35 y.o. female who presents to the Emergency Department complaining of chest pain.  She presents to the ED complaining of central chest pain that started at 7pm.  Pain is described as a pressure and tightness.  Episodes come and go and last a few minutes at a time.  She also has the same pain in her upper back.  She did have brief numbness to her right hand.    No fever, cough, N/V, abdominal pain.  Has some RLE edema that started today.    Has a hx/o anemia, takes iron.  No hx/o DVT/PE.      Past Medical History:  Diagnosis Date   Abnormal Pap smear of cervix    Anemia    Anxiety    Blood transfusion 2005   Breast mass 04/17/2016   left breast lump x 400 x 3 weeks with pain   IBS (irritable bowel syndrome)    Postpartum hemorrhage 09/2003   Swelling    Umbilical hernia     Patient Active Problem List   Diagnosis Date Noted   At risk for medication nonadherence 07/16/2020   NAFLD (nonalcoholic fatty liver disease) 19/41/7408   At risk for hyperglycemia 06/26/2020   Diabetes mellitus (HCC) 05/15/2020   Vitamin D deficiency 04/29/2020   Iron deficiency anemia 04/29/2020   Mixed diabetic hyperlipidemia associated with type 2 diabetes mellitus (HCC) 04/29/2020   Diabetes mellitus type 2, diet-controlled (HCC) 04/29/2020   Family history of diabetes mellitus (DM) 11/03/2019   Other irritable bowel syndrome 11/03/2019   Family history of hyperlipidemia 11/03/2019   Family history of obesity 11/03/2019   Family history of cancer 11/03/2019   Family history of hypertension 11/03/2019   Obesity, Class I, BMI 30-34.9 11/03/2019   Irritable bowel syndrome with both constipation and  diarrhea 11/03/2019   Post-dates pregnancy 06/20/2018   SVD (spontaneous vaginal delivery) 06/20/2018   Postpartum anemia 06/20/2018   Normal postpartum course 06/20/2018   Thyromegaly 05/23/2016   Anemia 01/03/2014   Folliculitis 11/20/2010    Past Surgical History:  Procedure Laterality Date   COLONOSCOPY     COLPOSCOPY       OB History     Gravida  6   Para  5   Term  5   Preterm  0   AB  1   Living  5      SAB  1   IAB      Ectopic      Multiple  0   Live Births  2           Family History  Problem Relation Age of Onset   Diabetes Mother    Hypertension Mother    Stroke Mother    Diabetes Father    Hypertension Father    Hyperlipidemia Father     Social History   Tobacco Use   Smoking status: Never   Smokeless tobacco: Never  Vaping Use   Vaping Use: Never used  Substance Use Topics   Alcohol use: No   Drug use: No    Home Medications Prior to Admission medications   Medication Sig Start Date End  Date Taking? Authorizing Provider  Cholecalciferol (VITAMIN D-3) 125 MCG (5000 UT) TABS Take 5,000 Int'l Units by mouth daily. 07/16/20   Thomasene Lot, DO  ferrous sulfate 325 (65 FE) MG EC tablet Take 1 tablet (325 mg total) by mouth 2 (two) times daily with a meal. 06/26/20   Opalski, Gavin Pound, DO    Allergies    Penicillins, Latex, Percocet [oxycodone-acetaminophen], Reglan [metoclopramide], and Clindamycin/lincomycin  Review of Systems   Review of Systems  Cardiovascular:  Positive for chest pain.  All other systems reviewed and are negative.  Physical Exam Updated Vital Signs BP 112/79 (BP Location: Left Arm)   Pulse 66   Temp 98.5 F (36.9 C) (Oral)   Resp 16   Ht 5\' 8"  (1.727 m)   Wt 100.7 kg   LMP 09/27/2020   SpO2 100%   BMI 33.75 kg/m   Physical Exam Vitals and nursing note reviewed.  Constitutional:      Appearance: She is well-developed.  HENT:     Head: Normocephalic and atraumatic.  Cardiovascular:      Rate and Rhythm: Normal rate and regular rhythm.     Heart sounds: No murmur heard. Pulmonary:     Effort: Pulmonary effort is normal. No respiratory distress.     Breath sounds: Normal breath sounds.  Abdominal:     Palpations: Abdomen is soft.     Tenderness: There is no abdominal tenderness. There is no guarding or rebound.  Musculoskeletal:        General: No tenderness.     Comments: Trace edema  Skin:    General: Skin is warm and dry.  Neurological:     Mental Status: She is alert and oriented to person, place, and time.  Psychiatric:        Behavior: Behavior normal.    ED Results / Procedures / Treatments   Labs (all labs ordered are listed, but only abnormal results are displayed) Labs Reviewed  BASIC METABOLIC PANEL - Abnormal; Notable for the following components:      Result Value   Glucose, Bld 100 (*)    All other components within normal limits  CBC WITH DIFFERENTIAL/PLATELET - Abnormal; Notable for the following components:   Hemoglobin 9.4 (*)    HCT 31.9 (*)    MCV 68.3 (*)    MCH 20.1 (*)    MCHC 29.5 (*)    RDW 19.2 (*)    All other components within normal limits  PREGNANCY, URINE  D-DIMER, QUANTITATIVE  CBC  TROPONIN I (HIGH SENSITIVITY)  TROPONIN I (HIGH SENSITIVITY)    EKG EKG Interpretation  Date/Time:  Friday October 12 2020 20:52:09 EDT Ventricular Rate:  83 PR Interval:  178 QRS Duration: 80 QT Interval:  372 QTC Calculation: 437 R Axis:   80 Text Interpretation: Normal sinus rhythm Normal ECG Confirmed by [birth year] [phone]) on 10/13/2020 2:02:34 AM  Radiology DG Chest 2 View  Result Date: 10/12/2020 CLINICAL DATA:  Chest pain for 1 hour.  Right hand numbness. EXAM: CHEST - 2 VIEW COMPARISON:  Radiograph 04/20/2017 FINDINGS: The cardiomediastinal contours are normal. The lungs are clear. Pulmonary vasculature is normal. No consolidation, pleural effusion, or pneumothorax. No acute osseous abnormalities are seen. IMPRESSION:  Negative radiographs of the chest. Electronically Signed   By: 04/22/2017 M.D.   On: 10/12/2020 21:20    Procedures Procedures   Medications Ordered in ED Medications - No data to display  ED Course  I have reviewed the triage vital  signs and the nursing notes.  Pertinent labs & imaging results that were available during my care of the patient were reviewed by me and considered in my medical decision making (see chart for details).    MDM Rules/Calculators/A&P                          patient here for evaluation of episodic chest pain. EKG is without acute ischemic changes. Troponin is negative. Doubt PE - she is low risk and D dimer is negative. Presentation is not consistent with ACS, dissection, pneumonia. CBC with stable anemia. Discussed with patient unclear source of symptoms. Feel she is stable for outpatient follow-up and return precautions.  Final Clinical Impression(s) / ED Diagnoses Final diagnoses:  Nonspecific chest pain    Rx / DC Orders ED Discharge Orders     None        Tilden Fossa, MD 10/13/20 [phone]

## 2020-10-15 ENCOUNTER — Telehealth: Payer: Self-pay

## 2020-10-15 NOTE — Telephone Encounter (Signed)
Transition Care Management Unsuccessful Follow-up Telephone Call  Date of discharge and from where:  [facility]  Attempts:  1st Attempt  Reason for unsuccessful TCM follow-up call:  Left voice message

## 2020-10-16 NOTE — Telephone Encounter (Signed)
Transition Care Management Unsuccessful Follow-up Telephone Call  Date of discharge and from where:  [facility]  Attempts:  2nd Attempt  Reason for unsuccessful TCM follow-up call:  Left voice message

## 2020-10-17 NOTE — Telephone Encounter (Signed)
Transition Care Management Unsuccessful Follow-up Telephone Call  Date of discharge and from where:  [facility]  Attempts:  3rd Attempt  Reason for unsuccessful TCM follow-up call:  Left voice message

## 2021-08-09 IMAGING — US US ABDOMEN LIMITED RUQ/ASCITES
1 series · 14 of 25 positions shown · non-contrast
Comparison: None.

CLINICAL DATA: Right upper quadrant pain x1 week.

EXAM:
ULTRASOUND ABDOMEN LIMITED RIGHT UPPER QUADRANT

[Series 1: us abdomen limited ruq (liver/gb) · 14 of 40 slices shown]
[im 1/40]
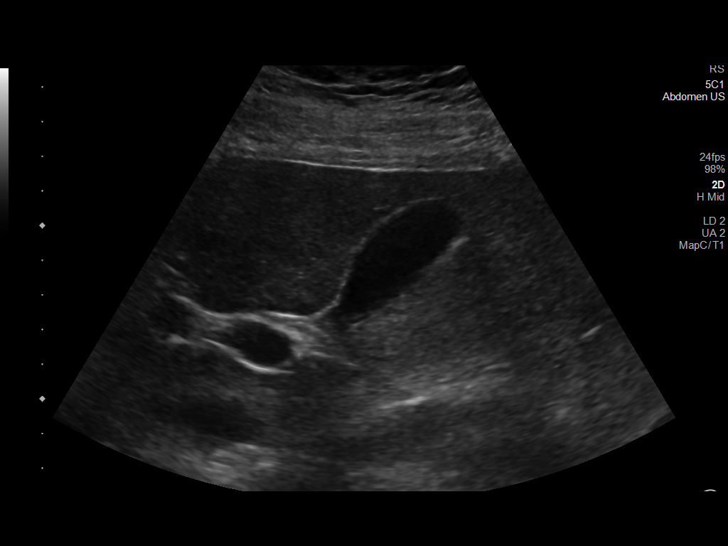
[im 4/40]
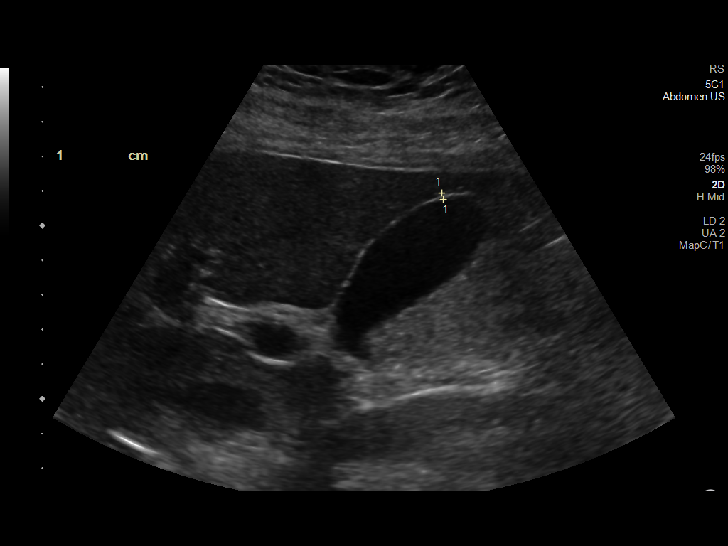
[im 7/40]
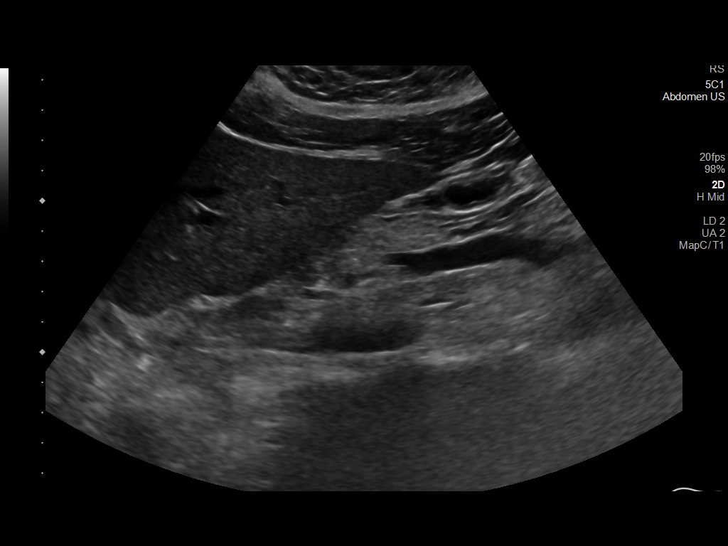
[im 10/40]
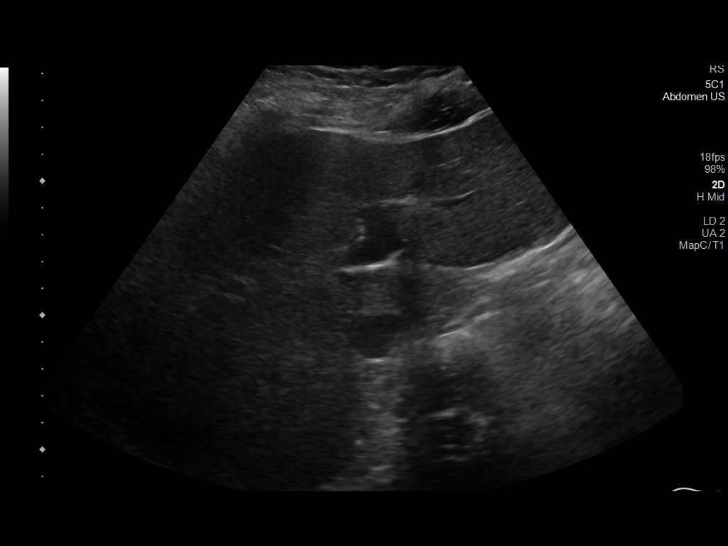
[im 14/40]
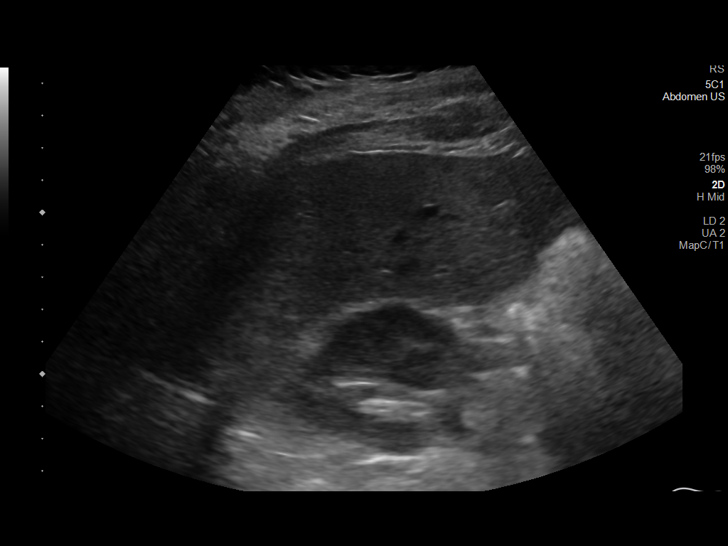
[im 15/40]
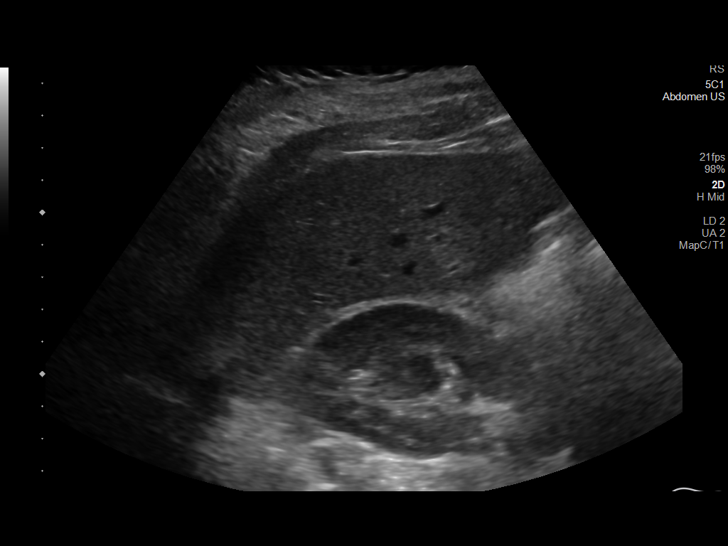
[im 18/40]
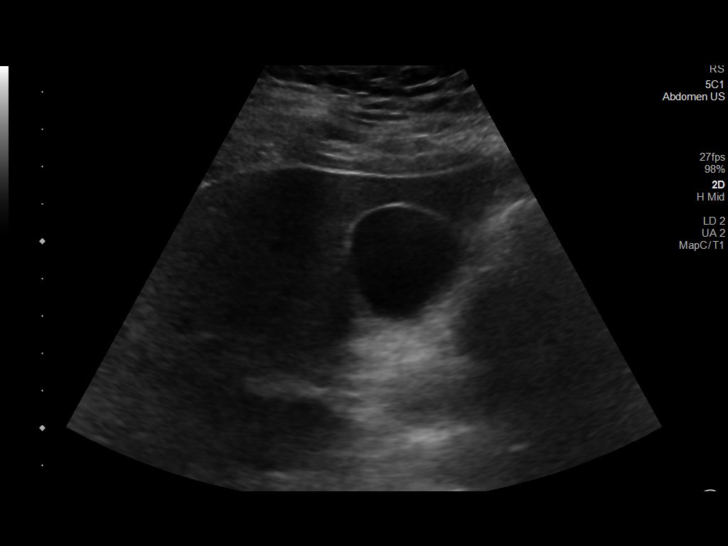
[im 22/40]
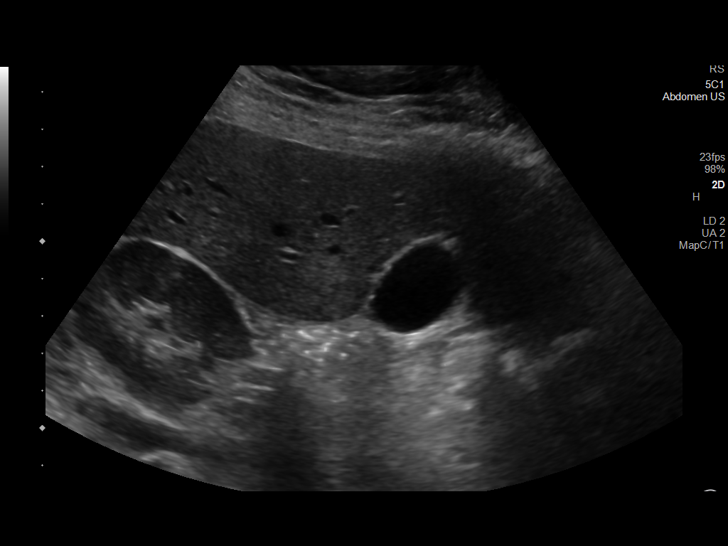
[im 25/40]
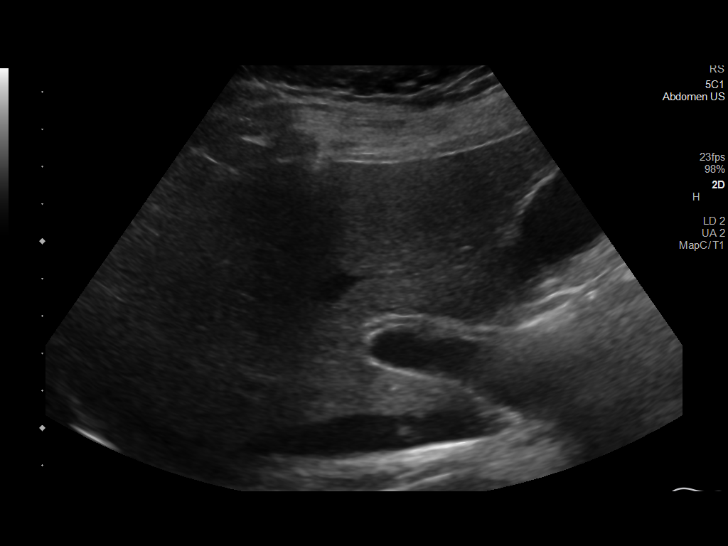
[im 27/40]
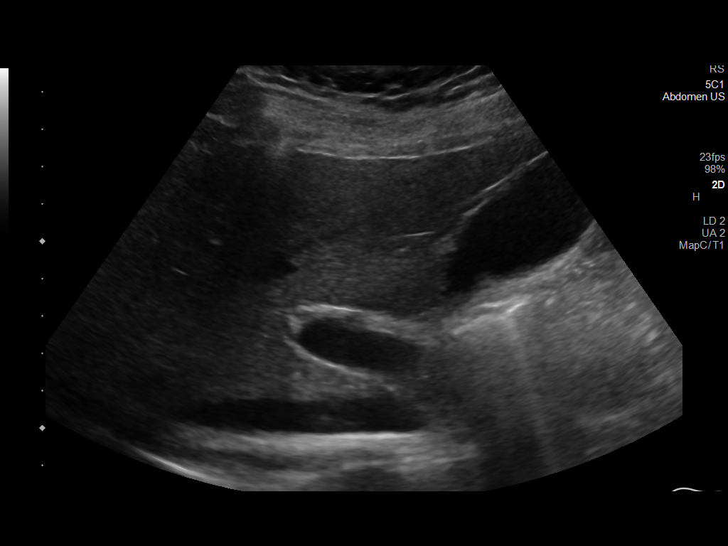
[im 30/40]
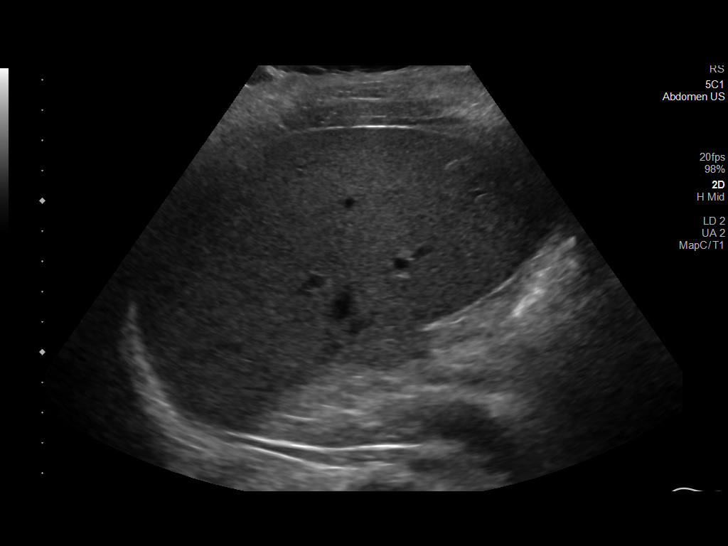
[im 33/40]
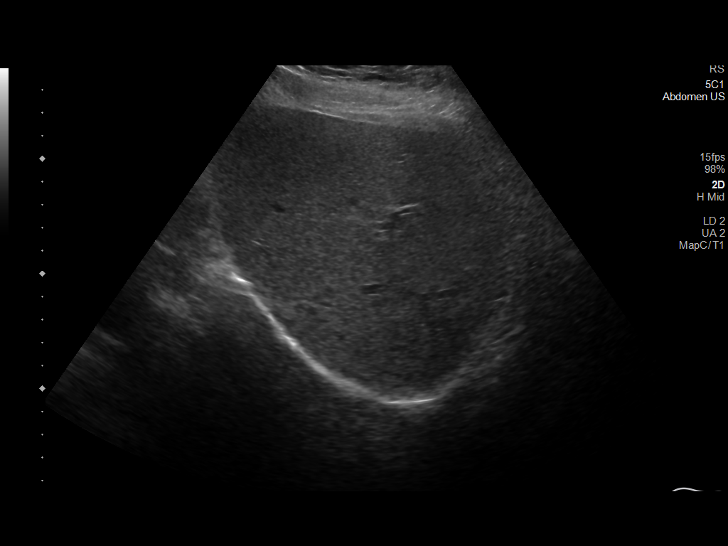
[im 36/40]
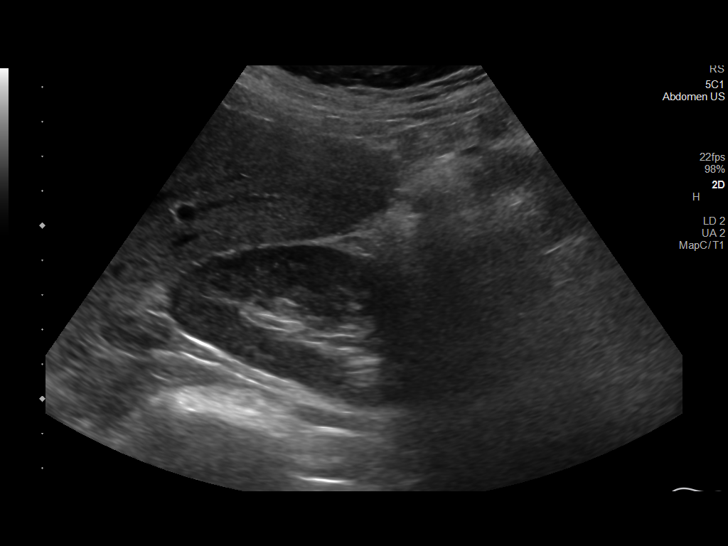
[im 40/40]
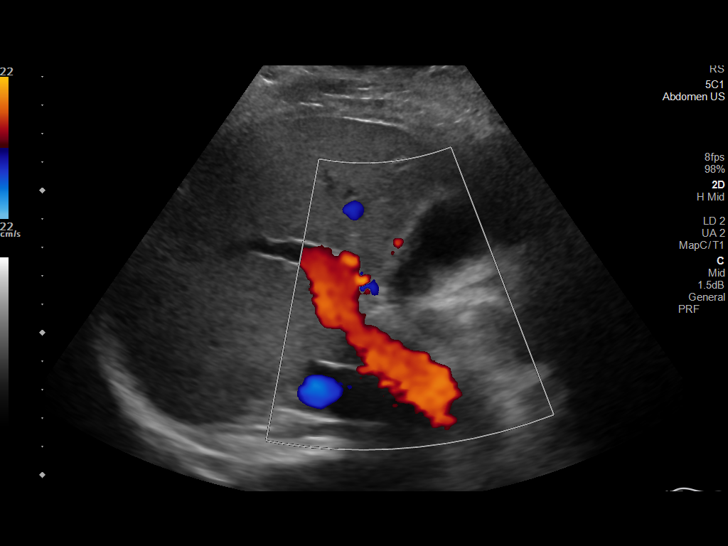

[14 of 25 positions shown; findings below may reference images not displayed]

FINDINGS: Gallbladder:

No gallstones or wall thickening visualized (1.9 mm). No sonographic
Murphy sign noted by sonographer.

Common bile duct:

Diameter: 1.7 mm

Liver:

No focal lesion identified. Diffusely increased echogenicity of the
liver parenchyma is noted. Portal vein is patent on color Doppler
imaging with normal direction of blood flow towards the liver.

Other: None.
IMPRESSION: Fatty liver.

## 2021-10-09 ENCOUNTER — Encounter (INDEPENDENT_AMBULATORY_CARE_PROVIDER_SITE_OTHER): Payer: Self-pay

## 2021-11-11 DIAGNOSIS — F321 Major depressive disorder, single episode, moderate: Secondary | ICD-10-CM | POA: Diagnosis not present

## 2021-11-11 DIAGNOSIS — F411 Generalized anxiety disorder: Secondary | ICD-10-CM | POA: Diagnosis not present

## 2021-11-11 DIAGNOSIS — E6609 Other obesity due to excess calories: Secondary | ICD-10-CM | POA: Diagnosis not present

## 2021-11-11 DIAGNOSIS — Z6836 Body mass index (BMI) 36.0-36.9, adult: Secondary | ICD-10-CM | POA: Diagnosis not present

## 2021-11-11 DIAGNOSIS — D649 Anemia, unspecified: Secondary | ICD-10-CM | POA: Diagnosis not present

## 2021-11-11 DIAGNOSIS — G479 Sleep disorder, unspecified: Secondary | ICD-10-CM | POA: Diagnosis not present

## 2021-11-11 DIAGNOSIS — F410 Panic disorder [episodic paroxysmal anxiety] without agoraphobia: Secondary | ICD-10-CM | POA: Diagnosis not present

## 2021-11-28 ENCOUNTER — Emergency Department (HOSPITAL_BASED_OUTPATIENT_CLINIC_OR_DEPARTMENT_OTHER): Payer: 59

## 2021-11-28 ENCOUNTER — Emergency Department (HOSPITAL_BASED_OUTPATIENT_CLINIC_OR_DEPARTMENT_OTHER)
Admission: EM | Admit: 2021-11-28 | Discharge: 2021-11-28 | Disposition: A | Discharge status: Home / Self Care | Payer: 59 | Attending: Emergency Medicine | Admitting: Emergency Medicine

## 2021-11-28 ENCOUNTER — Other Ambulatory Visit: Payer: Self-pay

## 2021-11-28 DIAGNOSIS — R072 Precordial pain: Secondary | ICD-10-CM | POA: Insufficient documentation

## 2021-11-28 DIAGNOSIS — Z91040 Latex allergy status: Secondary | ICD-10-CM | POA: Insufficient documentation

## 2021-11-28 LAB — CBC
HCT: 32.2 % — ABNORMAL LOW (ref 36.0–46.0)
Hemoglobin: 9.2 g/dL — ABNORMAL LOW (ref 12.0–15.0)
MCH: 18.6 pg — ABNORMAL LOW (ref 26.0–34.0)
MCHC: 28.6 g/dL — ABNORMAL LOW (ref 30.0–36.0)
MCV: 65.1 fL — ABNORMAL LOW (ref 80.0–100.0)
Platelets: 380 10*3/uL (ref 150–400)
RBC: 4.95 MIL/uL (ref 3.87–5.11)
RDW: 21.4 % — ABNORMAL HIGH (ref 11.5–15.5)
WBC: 6.1 10*3/uL (ref 4.0–10.5)
nRBC: 0 % (ref 0.0–0.2)

## 2021-11-28 LAB — PREGNANCY, URINE: Preg Test, Ur: NEGATIVE

## 2021-11-28 LAB — BASIC METABOLIC PANEL
Anion gap: 8 (ref 5–15)
BUN: 10 mg/dL (ref 6–20)
CO2: 22 mmol/L (ref 22–32)
Calcium: 9.1 mg/dL (ref 8.9–10.3)
Chloride: 107 mmol/L (ref 98–111)
Creatinine, Ser: 0.75 mg/dL (ref 0.44–1.00)
GFR, Estimated: >60 mL/min (ref >60)
Glucose, Bld: 107 mg/dL — ABNORMAL HIGH (ref 70–99)
Potassium: 3.3 mmol/L — ABNORMAL LOW (ref 3.5–5.1)
Sodium: 137 mmol/L (ref 135–145)

## 2021-11-28 LAB — TROPONIN I (HIGH SENSITIVITY)
Troponin I (High Sensitivity): <2 ng/L (ref <18)
Troponin I (High Sensitivity): <2 ng/L (ref <18)

## 2021-11-28 LAB — D-DIMER, QUANTITATIVE: D-Dimer, Quant: 0.36 ug/mL-FEU (ref 0.00–0.50)

## 2021-11-28 NOTE — ED Triage Notes (Signed)
Pt to ED c/o chest pain radiating to mid back x 1 hour ago, reports constant since. Reports was driving with this occurred. Reports SHOB. Denies cardiac history.

## 2021-11-28 NOTE — Discharge Instructions (Signed)
As we discussed, your work-up in the ER today was reassuring for acute findings.  Laboratory evaluation, chest x-ray, and EKG did not show any emergent concerns.  I recommend that you follow-up with your primary care doctor in the next few days for continued evaluation and management of your symptoms.  Return if development of any new or worsening symptoms.

## 2021-11-28 NOTE — ED Provider Notes (Signed)
[facility] EMERGENCY DEPARTMENT Provider Note   CSN: [id number] Arrival date & time: 11/28/21  1757     History  Chief Complaint  Patient presents with   Chest Pain    Patricia Macdonald is a 36 y.o. female.  Patient with history of anemia presents today with complaints of chest pain and shortness of breath.  She states that same came on suddenly 1 hour prior to her arrival today.  States that her pain is substernal in nature and does not radiate.  It is pleuritic in nature and not reproduced with palpation.  She denies any associated fevers, chills, cough, or congestion.  No known sick contacts.  No nausea, vomiting, or abdominal pain.  She denies any leg pain or leg swelling, recent surgeries, history of blood clots or malignancy, or recent travel.  Denies any cardiac history.  She does not smoke.  The history is provided by the patient. No language interpreter was used.  Chest Pain Associated symptoms: shortness of breath        Home Medications Prior to Admission medications   Medication Sig Start Date End Date Taking? Authorizing Provider  Cholecalciferol (VITAMIN D-3) 125 MCG (5000 UT) TABS Take 5,000 Int'l Units by mouth daily. 07/16/20   Mellody Dance, DO  ferrous sulfate 325 (65 FE) MG EC tablet Take 1 tablet (325 mg total) by mouth 2 (two) times daily with a meal. 06/26/20   Opalski, Neoma Laming, DO      Allergies    Penicillins, Latex, Percocet [oxycodone-acetaminophen], Reglan [metoclopramide], and Clindamycin/lincomycin    Review of Systems   Review of Systems  Respiratory:  Positive for shortness of breath.   Cardiovascular:  Positive for chest pain.  All other systems reviewed and are negative.   Physical Exam Updated Vital Signs BP 112/68   Pulse 94   Temp 98 F (36.7 C) (Oral)   Resp (!) 25   Ht 5\' 6"  (1.676 m)   Wt 104.3 kg   LMP 11/13/2021   SpO2 99%   BMI 37.12 kg/m  Physical Exam Vitals and nursing note reviewed.  Constitutional:       General: She is not in acute distress.    Appearance: Normal appearance. She is normal weight. She is not ill-appearing, toxic-appearing or diaphoretic.  HENT:     Head: Normocephalic and atraumatic.  Cardiovascular:     Rate and Rhythm: Normal rate and regular rhythm.     Pulses:          Dorsalis pedis pulses are 2+ on the right side and 2+ on the left side.       Posterior tibial pulses are 2+ on the right side and 2+ on the left side.     Heart sounds: Normal heart sounds.  Pulmonary:     Effort: Pulmonary effort is normal. No respiratory distress.  Chest:     Chest wall: No tenderness.  Abdominal:     Palpations: Abdomen is soft.  Musculoskeletal:        General: Normal range of motion.     Cervical back: Normal range of motion.  Skin:    General: Skin is warm and dry.  Neurological:     General: No focal deficit present.     Mental Status: She is alert.  Psychiatric:        Mood and Affect: Mood normal.        Behavior: Behavior normal.    ED Results / Procedures / Treatments  Labs (all labs ordered are listed, but only abnormal results are displayed) Labs Reviewed  BASIC METABOLIC PANEL - Abnormal; Notable for the following components:      Result Value   Potassium 3.3 (*)    Glucose, Bld 107 (*)    All other components within normal limits  CBC - Abnormal; Notable for the following components:   Hemoglobin 9.2 (*)    HCT 32.2 (*)    MCV 65.1 (*)    MCH 18.6 (*)    MCHC 28.6 (*)    RDW 21.4 (*)    All other components within normal limits  PREGNANCY, URINE  D-DIMER, QUANTITATIVE  TROPONIN I (HIGH SENSITIVITY)  TROPONIN I (HIGH SENSITIVITY)    EKG EKG Interpretation  Date/Time:  Thursday November 28 2021 18:23:31 EDT Ventricular Rate:  103 PR Interval:  174 QRS Duration: 76 QT Interval:  336 QTC Calculation: 440 R Axis:   71 Text Interpretation: Sinus tachycardia Possible Left atrial enlargement Borderline ECG When compared with ECG of  12-Oct-2020 20:52, PREVIOUS ECG IS PRESENT Confirmed by Virgina Norfolk [phone]) on 11/28/2021 6:39:17 PM  Radiology DG Chest 2 View  Result Date: 11/28/2021 CLINICAL DATA:  Chest pain EXAM: CHEST - 2 VIEW COMPARISON:  Radiographs 10/12/2020 FINDINGS: Heart size at the upper limits of normal and unchanged from 10/12/2020. No focal consolidation, pleural effusion, or pneumothorax. No acute osseous abnormality. IMPRESSION: No active cardiopulmonary disease. Electronically Signed   By: Minerva Fester M.D.   On: 11/28/2021 19:00    Procedures Procedures    Medications Ordered in ED Medications - No data to display  ED Course/ Medical Decision Making/ A&P                           Medical Decision Making Amount and/or Complexity of Data Reviewed Labs: ordered. Radiology: ordered.   This patient presents to the ED for concern of chest pain, shortness of breath, this involves an extensive number of treatment options, and is a complaint that carries with it a high risk of complications and morbidity.    Co morbidities that complicate the patient evaluation  History of anemia   Additional history obtained:  Additional history obtained from epic chart review   Lab Tests:  I Ordered, and personally interpreted labs.  The pertinent results include:  k 3.3, hemoglobin 9.2 unchanged from previous.  Delta troponin negative.  D-dimer negative.   Imaging Studies ordered:  I ordered imaging studies including chest x-ray I independently visualized and interpreted imaging which showed NAD I agree with the radiologist interpretation   Cardiac Monitoring: / EKG:  The patient was maintained on a cardiac monitor.  I personally viewed and interpreted the cardiac monitored which showed an underlying rhythm of: Sinus tachycardia   Test / Admission - Considered:  Given the large differential diagnosis for [name], the decision making in this case is of high complexity.  After  evaluating all of the data points in this case, the presentation of RHINA KRAMME is NOT consistent with Acute Coronary Syndrome (ACS) and/or myocardial ischemia, pulmonary embolism, aortic dissection;  significant arrythmia, pneumothorax, cardiac tamponade, or other emergent cardiopulmonary condition.  Further, the presentation of LUWANNA BROSSMAN is NOT consistent with pericarditis, myocarditis, mediastinitis, endocarditis, new valvular disease.  Additionally, the presentation of Laquesha S Jonesis NOT consistent with flail chest, cardiac contusion, ARDS, or significant intra-thoracic bleeding.  Moreover, this presentation is NOT consistent with pneumonia or sepsis  The patient has a   heart score 0, low risk.  She is also in a low risk age category with a benign work-up and unremarkable physical exam.  Her D-dimer is normal and she is low risk Wells.  Her delta troponin is normal as well, therefore very low suspicion for emergent concerns at this time.  Patient is stable for discharge.  Educated on red flag symptoms of prompt immediate return.  Patient is understanding and amenable with plan, discharged in stable condition.  Strict return and follow-up precautions have been given by me personally or by detailed written instruction given verbally by nursing staff using the teach back method to the patient/family/caregiver(s).  Data Reviewed/Counseling: I have reviewed the patient's vital signs, nursing notes, and other relevant tests/information. I had a detailed discussion regarding the historical points, exam findings, and any diagnostic results supporting the discharge diagnosis. I also discussed the need for outpatient follow-up and the need to return to the ED if symptoms worsen or if there are any questions or concerns that arise at home.  Final diagnoses:  Precordial chest pain    Rx / DC Orders ED Discharge Orders     None     An After Visit Summary was printed and given to the  patient.     Vear Clock 11/29/21 0055    Virgina Norfolk, DO 11/29/21 1154

## 2021-12-13 DIAGNOSIS — F411 Generalized anxiety disorder: Secondary | ICD-10-CM | POA: Diagnosis not present

## 2021-12-13 DIAGNOSIS — F410 Panic disorder [episodic paroxysmal anxiety] without agoraphobia: Secondary | ICD-10-CM | POA: Diagnosis not present

## 2021-12-13 DIAGNOSIS — F321 Major depressive disorder, single episode, moderate: Secondary | ICD-10-CM | POA: Diagnosis not present

## 2022-01-07 DIAGNOSIS — L509 Urticaria, unspecified: Secondary | ICD-10-CM | POA: Diagnosis not present

## 2022-02-10 DIAGNOSIS — J209 Acute bronchitis, unspecified: Secondary | ICD-10-CM | POA: Diagnosis not present

## 2022-06-03 DIAGNOSIS — R5383 Other fatigue: Secondary | ICD-10-CM | POA: Diagnosis not present

## 2022-06-04 DIAGNOSIS — R5383 Other fatigue: Secondary | ICD-10-CM | POA: Diagnosis not present

## 2022-06-04 DIAGNOSIS — D509 Iron deficiency anemia, unspecified: Secondary | ICD-10-CM | POA: Diagnosis not present

## 2022-06-04 DIAGNOSIS — R7303 Prediabetes: Secondary | ICD-10-CM | POA: Diagnosis not present

## 2022-06-04 DIAGNOSIS — F410 Panic disorder [episodic paroxysmal anxiety] without agoraphobia: Secondary | ICD-10-CM | POA: Diagnosis not present

## 2022-06-04 DIAGNOSIS — E559 Vitamin D deficiency, unspecified: Secondary | ICD-10-CM | POA: Diagnosis not present

## 2022-06-04 DIAGNOSIS — F321 Major depressive disorder, single episode, moderate: Secondary | ICD-10-CM | POA: Diagnosis not present

## 2022-06-13 ENCOUNTER — Emergency Department (HOSPITAL_BASED_OUTPATIENT_CLINIC_OR_DEPARTMENT_OTHER)
Admission: EM | Admit: 2022-06-13 | Discharge: 2022-06-13 | Disposition: A | Discharge status: Home / Self Care | Payer: 59 | Attending: Emergency Medicine | Admitting: Emergency Medicine

## 2022-06-13 ENCOUNTER — Other Ambulatory Visit: Payer: Self-pay

## 2022-06-13 ENCOUNTER — Emergency Department (HOSPITAL_BASED_OUTPATIENT_CLINIC_OR_DEPARTMENT_OTHER): Payer: 59

## 2022-06-13 ENCOUNTER — Encounter (HOSPITAL_BASED_OUTPATIENT_CLINIC_OR_DEPARTMENT_OTHER): Payer: Self-pay | Admitting: Urology

## 2022-06-13 DIAGNOSIS — R0602 Shortness of breath: Secondary | ICD-10-CM | POA: Diagnosis not present

## 2022-06-13 DIAGNOSIS — Z91040 Latex allergy status: Secondary | ICD-10-CM | POA: Insufficient documentation

## 2022-06-13 DIAGNOSIS — M62830 Muscle spasm of back: Secondary | ICD-10-CM | POA: Diagnosis not present

## 2022-06-13 DIAGNOSIS — D509 Iron deficiency anemia, unspecified: Secondary | ICD-10-CM

## 2022-06-13 DIAGNOSIS — D649 Anemia, unspecified: Secondary | ICD-10-CM | POA: Diagnosis not present

## 2022-06-13 DIAGNOSIS — R81 Glycosuria: Secondary | ICD-10-CM | POA: Diagnosis not present

## 2022-06-13 DIAGNOSIS — R059 Cough, unspecified: Secondary | ICD-10-CM | POA: Diagnosis not present

## 2022-06-13 DIAGNOSIS — M79604 Pain in right leg: Secondary | ICD-10-CM | POA: Diagnosis not present

## 2022-06-13 DIAGNOSIS — J069 Acute upper respiratory infection, unspecified: Secondary | ICD-10-CM | POA: Insufficient documentation

## 2022-06-13 DIAGNOSIS — Z1152 Encounter for screening for COVID-19: Secondary | ICD-10-CM | POA: Insufficient documentation

## 2022-06-13 LAB — CBC WITH DIFFERENTIAL/PLATELET
Abs Immature Granulocytes: 0.01 10*3/uL (ref 0.00–0.07)
Basophils Absolute: 0.1 10*3/uL (ref 0.0–0.1)
Basophils Relative: 1 %
Eosinophils Absolute: 0.2 10*3/uL (ref 0.0–0.5)
Eosinophils Relative: 3 %
HCT: 30.9 % — ABNORMAL LOW (ref 36.0–46.0)
Hemoglobin: 8.6 g/dL — ABNORMAL LOW (ref 12.0–15.0)
Immature Granulocytes: 0 %
Lymphocytes Relative: 31 %
Lymphs Abs: 1.8 10*3/uL (ref 0.7–4.0)
MCH: 18.1 pg — ABNORMAL LOW (ref 26.0–34.0)
MCHC: 27.8 g/dL — ABNORMAL LOW (ref 30.0–36.0)
MCV: 65.1 fL — ABNORMAL LOW (ref 80.0–100.0)
Monocytes Absolute: 0.5 10*3/uL (ref 0.1–1.0)
Monocytes Relative: 9 %
Neutro Abs: 3.4 10*3/uL (ref 1.7–7.7)
Neutrophils Relative %: 56 %
Platelets: 385 10*3/uL (ref 150–400)
RBC: 4.75 MIL/uL (ref 3.87–5.11)
RDW: 19.2 % — ABNORMAL HIGH (ref 11.5–15.5)
WBC: 6 10*3/uL (ref 4.0–10.5)
nRBC: 0 % (ref 0.0–0.2)

## 2022-06-13 LAB — URINALYSIS, ROUTINE W REFLEX MICROSCOPIC
Bilirubin Urine: NEGATIVE
Glucose, UA: 250 mg/dL — AB
Ketones, ur: NEGATIVE mg/dL
Leukocytes,Ua: NEGATIVE
Nitrite: NEGATIVE
Protein, ur: NEGATIVE mg/dL
Specific Gravity, Urine: >=1.03 (ref 1.005–1.030)
pH: 6 (ref 5.0–8.0)

## 2022-06-13 LAB — RESP PANEL BY RT-PCR (RSV, FLU A&B, COVID)  RVPGX2
Influenza A by PCR: NEGATIVE
Influenza B by PCR: NEGATIVE
Resp Syncytial Virus by PCR: NEGATIVE
SARS Coronavirus 2 by RT PCR: NEGATIVE

## 2022-06-13 LAB — BASIC METABOLIC PANEL
Anion gap: 8 (ref 5–15)
BUN: 14 mg/dL (ref 6–20)
CO2: 23 mmol/L (ref 22–32)
Calcium: 8.7 mg/dL — ABNORMAL LOW (ref 8.9–10.3)
Chloride: 103 mmol/L (ref 98–111)
Creatinine, Ser: 0.74 mg/dL (ref 0.44–1.00)
GFR, Estimated: >60 mL/min (ref >60)
Glucose, Bld: 84 mg/dL (ref 70–99)
Potassium: 3.6 mmol/L (ref 3.5–5.1)
Sodium: 134 mmol/L — ABNORMAL LOW (ref 135–145)

## 2022-06-13 LAB — URINALYSIS, MICROSCOPIC (REFLEX): WBC, UA: NONE SEEN WBC/hpf (ref 0–5)

## 2022-06-13 LAB — PREGNANCY, URINE: Preg Test, Ur: NEGATIVE

## 2022-06-13 MED ORDER — METHOCARBAMOL 500 MG PO TABS: 500.0000 mg | ORAL_TABLET | Freq: Three times a day (TID) | ORAL | 0 refills | Status: AC | PRN

## 2022-06-13 MED ORDER — KETOROLAC TROMETHAMINE 30 MG/ML IJ SOLN
30.0000 mg | Freq: Once | INTRAMUSCULAR | Status: AC
  Administered 2022-06-13: 30 mg via INTRAVENOUS
  Filled 2022-06-13: qty 1

## 2022-06-13 MED ORDER — NAPROXEN 375 MG PO TABS: 375.0000 mg | ORAL_TABLET | Freq: Two times a day (BID) | ORAL | 0 refills | Status: AC

## 2022-06-13 NOTE — Discharge Instructions (Addendum)
You appear to have an upper respiratory infection (URI). An upper respiratory tract infection, or cold, is a viral infection of the air passages leading to the lungs. It is contagious and can be spread to others, especially during the first 3 or 4 days. It cannot be cured by antibiotics or other medicines. °RETURN IMMEDIATELY IF you develop shortness of breath, confusion or altered mental status, a new rash, become dizzy, faint, or poorly responsive, or are unable to be cared for at home. ° °

## 2022-06-13 NOTE — ED Provider Notes (Signed)
[facility] Provider Note   CSN: [id number] Arrival date & time: 06/13/22  1510     History  Chief Complaint  Patient presents with   Cough    Patricia Macdonald is a 37 y.o. female who presents emergency department with chief complaint of cough and pleuritic chest pain.  Patient states that she has been sick at home with subjective fever nausea vomiting diarrhea and cough for the past several days.  She states she is feels like she is getting better however she developed pain in her right mid back which is worse with taking a deep breath and she is also feeling short of breath.  She denies productive cough, hemoptysis, history of PE, current abdominal pain.  She did think that her right leg was mildly swollen yesterday but does not have any today patient does not take exogenous estrogens.  She has been in the bed recently but denies any other prolonged confinement, surgeries.   Cough      Home Medications Prior to Admission medications   Medication Sig Start Date End Date Taking? Authorizing Provider  Cholecalciferol (VITAMIN D-3) 125 MCG (5000 UT) TABS Take 5,000 Int'l Units by mouth daily. 07/16/20   Thomasene Lot, DO  ferrous sulfate 325 (65 FE) MG EC tablet Take 1 tablet (325 mg total) by mouth 2 (two) times daily with a meal. 06/26/20   Opalski, Gavin Pound, DO      Allergies    Penicillins, Latex, Percocet [oxycodone-acetaminophen], Reglan [metoclopramide], and Clindamycin/lincomycin    Review of Systems   Review of Systems  Respiratory:  Positive for cough.     Physical Exam Updated Vital Signs BP 122/86 (BP Location: Left Arm)   Pulse 88   Temp 98.6 F (37 C) (Oral)   Resp 18   Ht  (1.676 m)   Wt 90.7 kg   LMP 06/13/2022 (Exact Date)   SpO2 100%   BMI 32.28 kg/m  Physical Exam Vitals and nursing note reviewed.  Constitutional:      General: She is not in acute distress.    Appearance: She is well-developed.  She is not diaphoretic.  HENT:     Head: Normocephalic and atraumatic.     Right Ear: External ear normal.     Left Ear: External ear normal.     Nose: Nose normal.     Mouth/Throat:     Mouth: Mucous membranes are moist.  Eyes:     General: No scleral icterus.    Conjunctiva/sclera: Conjunctivae normal.  Cardiovascular:     Rate and Rhythm: Normal rate and regular rhythm.     Heart sounds: Normal heart sounds. No murmur heard.    No friction rub. No gallop.  Pulmonary:     Effort: Pulmonary effort is normal. No respiratory distress.     Breath sounds: Normal breath sounds. No stridor. No wheezing or rhonchi.  Abdominal:     General: Bowel sounds are normal. There is no distension.     Palpations: Abdomen is soft. There is no mass.     Tenderness: There is no abdominal tenderness. There is no guarding.  Musculoskeletal:       Arms:     Cervical back: Normal range of motion.     Right lower leg: No edema.     Left lower leg: No edema.     Comments: Tender to palpation in the right mid thoracic region posteriorly  Skin:    General:  Skin is warm and dry.  Neurological:     Mental Status: She is alert and oriented to person, place, and time.  Psychiatric:        Behavior: Behavior normal.     ED Results / Procedures / Treatments   Labs (all labs ordered are listed, but only abnormal results are displayed) Labs Reviewed  RESP PANEL BY RT-PCR (RSV, FLU A&B, COVID)  [id number]    EKG None  Radiology No results found.  Procedures Procedures    Medications Ordered in ED Medications - No data to display  ED Course/ Medical Decision Making/ A&P Clinical Course as of 06/13/22 1816  Fri Jun 13, 2022  1744 CBC with Differential(!) [AH]  1744 Hemoglobin(!): 8.6 [AH]  1809 EKG 12-Lead Visualized and interpreted twelve-lead EKG which shows normal sinus rhythm at a rate of 85 [AH]    Clinical Course User Index [AH] Patricia Captain, PA-C                              Medical Decision Making Given the large differential diagnosis for Patricia Macdonald, the decision making in this case is of high complexity.  After evaluating all of the data points in this case, the presentation of Patricia Macdonald is NOT consistent with Acute Coronary Syndrome (ACS) and/or myocardial ischemia, pulmonary embolism, aortic dissection; Borhaave's, significant arrythmia, pneumothorax, cardiac tamponade, or other emergent cardiopulmonary condition.  Further, the presentation of Patricia Macdonald is NOT consistent with pericarditis, myocarditis, cholecystitis, pancreatitis, mediastinitis, endocarditis, new valvular disease.  Additionally, the presentation of Patricia Macdonald NOT consistent with flail chest, cardiac contusion, ARDS, or significant intra-thoracic or intra-abdominal bleeding.  Moreover, this presentation is NOT consistent with pneumonia, sepsis, or pyelonephritis.     Strict return and follow-up precautions have been given by me personally or by detailed written instruction given verbally by nursing staff using the teach back method to the patient/family/caregiver(s).  Data Reviewed/Counseling: I have reviewed the patient's vital signs, nursing notes, and other relevant tests/information. I had a detailed discussion regarding the historical points, exam findings, and any diagnostic results supporting the discharge diagnosis. I also discussed the need for outpatient follow-up and the need to return to the ED if symptoms worsen or if there are any questions or concerns that arise at home.    Amount and/or Complexity of Data Reviewed Labs: ordered. Decision-making details documented in ED Course.    Details: Per ED course Radiology: ordered and independent interpretation performed.    Details: I personally visualized and interpreted the images using our PACS system. Acute findings include:  No acute findings on [place] of RLE or CXR ECG/medicine tests: ordered and  independent interpretation performed. Decision-making details documented in ED Course.  Risk Prescription drug management. Risk Details: Exam not consistent with ACS. PERC negative, No DVT           Final Clinical Impression(s) / ED Diagnoses Final diagnoses:  Spasm of thoracic back muscle  Upper respiratory tract infection, unspecified type  Glycosuria  Microcytic anemia    Rx / DC Orders ED Discharge Orders     None         Patricia Captain, PA-C 06/13/22 1819    Tegeler, Canary Brim, MD 06/13/22 1842

## 2022-06-13 NOTE — ED Triage Notes (Addendum)
Pt states she has been having shortness of breath and pain in her back with deep breathing that started last night  States fever, chills, and body aches that started on Wednesday   LS clear throughout

## 2022-06-13 NOTE — ED Notes (Signed)
Attempted to round on pt, not in the room, clothing on the chair

## 2022-06-24 DIAGNOSIS — R5383 Other fatigue: Secondary | ICD-10-CM | POA: Diagnosis not present

## 2022-06-24 DIAGNOSIS — D509 Iron deficiency anemia, unspecified: Secondary | ICD-10-CM | POA: Diagnosis not present

## 2022-07-14 DIAGNOSIS — R5383 Other fatigue: Secondary | ICD-10-CM | POA: Diagnosis not present

## 2022-07-14 DIAGNOSIS — D649 Anemia, unspecified: Secondary | ICD-10-CM | POA: Diagnosis not present

## 2022-07-14 DIAGNOSIS — D509 Iron deficiency anemia, unspecified: Secondary | ICD-10-CM | POA: Diagnosis not present

## 2022-07-21 DIAGNOSIS — E611 Iron deficiency: Secondary | ICD-10-CM | POA: Diagnosis not present

## 2022-07-22 DIAGNOSIS — E611 Iron deficiency: Secondary | ICD-10-CM | POA: Diagnosis not present

## 2022-07-23 DIAGNOSIS — E611 Iron deficiency: Secondary | ICD-10-CM | POA: Diagnosis not present

## 2022-08-19 DIAGNOSIS — D509 Iron deficiency anemia, unspecified: Secondary | ICD-10-CM | POA: Diagnosis not present

## 2022-10-01 DIAGNOSIS — D509 Iron deficiency anemia, unspecified: Secondary | ICD-10-CM | POA: Diagnosis not present

## 2022-10-14 DIAGNOSIS — D509 Iron deficiency anemia, unspecified: Secondary | ICD-10-CM | POA: Diagnosis not present

## 2022-10-14 DIAGNOSIS — R5383 Other fatigue: Secondary | ICD-10-CM | POA: Diagnosis not present

## 2022-10-24 DIAGNOSIS — E611 Iron deficiency: Secondary | ICD-10-CM | POA: Diagnosis not present

## 2023-03-10 DIAGNOSIS — F410 Panic disorder [episodic paroxysmal anxiety] without agoraphobia: Secondary | ICD-10-CM | POA: Diagnosis not present

## 2023-03-10 DIAGNOSIS — R5382 Chronic fatigue, unspecified: Secondary | ICD-10-CM | POA: Diagnosis not present

## 2023-03-10 DIAGNOSIS — F411 Generalized anxiety disorder: Secondary | ICD-10-CM | POA: Diagnosis not present

## 2023-03-10 DIAGNOSIS — E559 Vitamin D deficiency, unspecified: Secondary | ICD-10-CM | POA: Diagnosis not present

## 2023-03-10 DIAGNOSIS — F39 Unspecified mood [affective] disorder: Secondary | ICD-10-CM | POA: Diagnosis not present

## 2023-03-10 DIAGNOSIS — R7303 Prediabetes: Secondary | ICD-10-CM | POA: Diagnosis not present

## 2023-03-10 DIAGNOSIS — R41840 Attention and concentration deficit: Secondary | ICD-10-CM | POA: Diagnosis not present

## 2023-03-10 DIAGNOSIS — L659 Nonscarring hair loss, unspecified: Secondary | ICD-10-CM | POA: Diagnosis not present

## 2023-03-19 DIAGNOSIS — R109 Unspecified abdominal pain: Secondary | ICD-10-CM | POA: Diagnosis not present

## 2023-03-19 DIAGNOSIS — E119 Type 2 diabetes mellitus without complications: Secondary | ICD-10-CM | POA: Diagnosis not present

## 2023-04-14 DIAGNOSIS — D509 Iron deficiency anemia, unspecified: Secondary | ICD-10-CM | POA: Diagnosis not present

## 2023-04-14 DIAGNOSIS — L03211 Cellulitis of face: Secondary | ICD-10-CM | POA: Diagnosis not present

## 2023-04-14 DIAGNOSIS — E611 Iron deficiency: Secondary | ICD-10-CM | POA: Diagnosis not present

## 2023-04-21 DIAGNOSIS — E119 Type 2 diabetes mellitus without complications: Secondary | ICD-10-CM | POA: Diagnosis not present

## 2023-06-23 ENCOUNTER — Emergency Department (HOSPITAL_BASED_OUTPATIENT_CLINIC_OR_DEPARTMENT_OTHER)

## 2023-06-23 ENCOUNTER — Other Ambulatory Visit: Payer: Self-pay

## 2023-06-23 ENCOUNTER — Emergency Department (HOSPITAL_BASED_OUTPATIENT_CLINIC_OR_DEPARTMENT_OTHER)
Admission: EM | Admit: 2023-06-23 | Discharge: 2023-06-23 | Disposition: A | Discharge status: Home / Self Care | Attending: Emergency Medicine | Admitting: Emergency Medicine

## 2023-06-23 ENCOUNTER — Encounter (HOSPITAL_BASED_OUTPATIENT_CLINIC_OR_DEPARTMENT_OTHER): Payer: Self-pay | Admitting: Emergency Medicine

## 2023-06-23 DIAGNOSIS — R9431 Abnormal electrocardiogram [ECG] [EKG]: Secondary | ICD-10-CM | POA: Diagnosis not present

## 2023-06-23 DIAGNOSIS — M7989 Other specified soft tissue disorders: Secondary | ICD-10-CM | POA: Insufficient documentation

## 2023-06-23 DIAGNOSIS — R600 Localized edema: Secondary | ICD-10-CM | POA: Diagnosis not present

## 2023-06-23 DIAGNOSIS — Z91040 Latex allergy status: Secondary | ICD-10-CM | POA: Diagnosis not present

## 2023-06-23 DIAGNOSIS — R609 Edema, unspecified: Secondary | ICD-10-CM

## 2023-06-23 LAB — BASIC METABOLIC PANEL WITH GFR
Anion gap: 14 (ref 5–15)
BUN: 10 mg/dL (ref 6–20)
CO2: 20 mmol/L — ABNORMAL LOW (ref 22–32)
Calcium: 9.2 mg/dL (ref 8.9–10.3)
Chloride: 104 mmol/L (ref 98–111)
Creatinine, Ser: 0.87 mg/dL (ref 0.44–1.00)
GFR, Estimated: >60 mL/min (ref >60)
Glucose, Bld: 135 mg/dL — ABNORMAL HIGH (ref 70–99)
Potassium: 3.7 mmol/L (ref 3.5–5.1)
Sodium: 138 mmol/L (ref 135–145)

## 2023-06-23 LAB — CBC
HCT: 37.6 % (ref 36.0–46.0)
Hemoglobin: 12 g/dL (ref 12.0–15.0)
MCH: 26.1 pg (ref 26.0–34.0)
MCHC: 31.9 g/dL (ref 30.0–36.0)
MCV: 81.7 fL (ref 80.0–100.0)
Platelets: 312 10*3/uL (ref 150–400)
RBC: 4.6 MIL/uL (ref 3.87–5.11)
RDW: 14.6 % (ref 11.5–15.5)
WBC: 5.8 10*3/uL (ref 4.0–10.5)
nRBC: 0 % (ref 0.0–0.2)

## 2023-06-23 LAB — PREGNANCY, URINE: Preg Test, Ur: NEGATIVE

## 2023-06-23 NOTE — ED Provider Notes (Signed)
 [facility] Provider Note   CSN: [id number] Arrival date & time: 06/23/23  1825     History  Chief Complaint  Patient presents with   Leg Swelling    Patricia Macdonald is a 38 y.o. female.  The history is provided by the patient.  Illness Location:  B feet Quality:  Swelling Severity:  Mild Onset quality:  Gradual Duration: days. Progression:  Unchanged Chronicity:  New Relieved by:  Nothign Worsened by:  Nothing Associated symptoms: no abdominal pain, no chest pain, no congestion, no fatigue, no shortness of breath, no sore throat and no vomiting        Home Medications Prior to Admission medications   Medication Sig Start Date End Date Taking? Authorizing Provider  Cholecalciferol (VITAMIN D -3) 125 MCG (5000 UT) TABS Take 5,000 Int'l Units by mouth daily. 07/16/20   Marceil Sensor, DO  ferrous sulfate  325 (65 FE) MG EC tablet Take 1 tablet (325 mg total) by mouth 2 (two) times daily with a meal. 06/26/20   Opalski, Bernardo Bridgeman, DO  methocarbamol  (ROBAXIN ) 500 MG tablet Take 1 tablet (500 mg total) by mouth 3 (three) times daily as needed for muscle spasms. 06/13/22   Harris, Abigail, PA-C  naproxen  (NAPROSYN ) 375 MG tablet Take 1 tablet (375 mg total) by mouth 2 (two) times daily with a meal. 06/13/22   Tama Fails, PA-C      Allergies    Penicillins, Latex, Percocet [oxycodone -acetaminophen ], Reglan  [metoclopramide ], and Clindamycin/lincomycin    Review of Systems   Review of Systems  Constitutional:  Negative for fatigue.  HENT:  Negative for congestion and sore throat.   Eyes:  Negative for photophobia.  Respiratory:  Negative for shortness of breath.   Cardiovascular:  Negative for chest pain.  Gastrointestinal:  Negative for abdominal pain and vomiting.  All other systems reviewed and are negative.   Physical Exam Updated Vital Signs BP 138/89   Pulse 86   Temp 98 F (36.7 C)   Resp 16   SpO2 100%   Physical Exam Vitals and nursing note reviewed.  Constitutional:      General: She is not in acute distress.    Appearance: Normal appearance. She is well-developed.  HENT:     Head: Normocephalic and atraumatic.     Nose: Nose normal.  Eyes:     Pupils: Pupils are equal, round, and reactive to light.  Cardiovascular:     Rate and Rhythm: Normal rate and regular rhythm.     Pulses: Normal pulses.     Heart sounds: Normal heart sounds.  Pulmonary:     Effort: Pulmonary effort is normal. No respiratory distress.     Breath sounds: Normal breath sounds.  Abdominal:     General: Bowel sounds are normal. There is no distension.     Palpations: Abdomen is soft.     Tenderness: There is no abdominal tenderness. There is no guarding or rebound.  Musculoskeletal:        General: Normal range of motion.     Cervical back: Neck supple.     Comments: Trace non pitting foot edema  Skin:    General: Skin is dry.     Capillary Refill: Capillary refill takes less than 2 seconds.     Findings: No erythema or rash.  Neurological:     General: No focal deficit present.     Mental Status: She is alert.     Deep Tendon Reflexes: Reflexes  normal.  Psychiatric:        Mood and Affect: Mood normal.     ED Results / Procedures / Treatments   Labs (all labs ordered are listed, but only abnormal results are displayed) Results for orders placed or performed during the hospital encounter of 06/23/23  Basic metabolic panel   Collection Time: 06/23/23  7:08 PM  Result Value Ref Range   Sodium 138 135 - 145 mmol/L   Potassium 3.7 3.5 - 5.1 mmol/L   Chloride 104 98 - 111 mmol/L   CO2 20 (L) 22 - 32 mmol/L   Glucose, Bld 135 (H) 70 - 99 mg/dL   BUN 10 6 - 20 mg/dL   Creatinine, Ser 1.61 0.44 - 1.00 mg/dL   Calcium 9.2 8.9 - 09.6 mg/dL   GFR, Estimated >04 >54 mL/min   Anion gap 14 5 - 15  CBC   Collection Time: 06/23/23  7:08 PM  Result Value Ref Range   WBC 5.8 4.0 - 10.5 K/uL   RBC 4.60  3.87 - 5.11 MIL/uL   Hemoglobin 12.0 12.0 - 15.0 g/dL   HCT 09.8 11.9 - 14.7 %   MCV 81.7 80.0 - 100.0 fL   MCH 26.1 26.0 - 34.0 pg   MCHC 31.9 30.0 - 36.0 g/dL   RDW 82.9 56.2 - 13.0 %   Platelets 312 150 - 400 K/uL   nRBC 0.0 0.0 - 0.2 %  Pregnancy, urine   Collection Time: 06/23/23  7:08 PM  Result Value Ref Range   Preg Test, Ur NEGATIVE NEGATIVE   DG Chest 2 View Result Date: 06/23/2023 CLINICAL DATA:  Leg swelling EXAM: CHEST - 2 VIEW COMPARISON:  Chest x-ray 06/13/2022 FINDINGS: The heart size and mediastinal contours are within normal limits. Both lungs are clear. The visualized skeletal structures are unremarkable. IMPRESSION: No active cardiopulmonary disease. Electronically Signed   By: Tyron Gallon M.D.   On: 06/23/2023 19:29    EKG EKG Interpretation Date/Time:  Tuesday Cynia Abruzzo 22 2025 18:45:32 EDT Ventricular Rate:  90 PR Interval:  178 QRS Duration:  78 QT Interval:  376 QTC Calculation: 459 R Axis:   71  Text Interpretation: Normal sinus rhythm Possible Left atrial enlargement Low voltage QRS Confirmed by Maralee Senate, Lynnita Somma ([id number]) on 06/23/2023 11:00:27 PM  Radiology DG Chest 2 View Result Date: 06/23/2023 CLINICAL DATA:  Leg swelling EXAM: CHEST - 2 VIEW COMPARISON:  Chest x-ray 06/13/2022 FINDINGS: The heart size and mediastinal contours are within normal limits. Both lungs are clear. The visualized skeletal structures are unremarkable. IMPRESSION: No active cardiopulmonary disease. Electronically Signed   By: Tyron Gallon M.D.   On: 06/23/2023 19:29    Procedures Procedures    Medications Ordered in ED Medications - No data to display  ED Course/ Medical Decision Making/ A&P                                 Medical Decision Making Patient with pedal edema   Amount and/or Complexity of Data Reviewed External Data Reviewed: notes.    Details: Previous notes reviewed  Labs: ordered.    Details: Normal sodium 138, normal potassium 3.7, normal creatinine.   Negative pregnancy test  Radiology: ordered and independent interpretation performed.    Details: Normal cxr  ECG/medicine tests: ordered and independent interpretation performed. Decision-making details documented in ED Course.  Risk Risk Details: Edema is of the feet and is non  pitting this is consistent with dependent edema.  Ice elevation and compression stockings.  Stable for discharge     Final Clinical Impression(s) / ED Diagnoses Final diagnoses:  Dependent edema    No signs of systemic illness or infection. The patient is nontoxic-appearing on exam and vital signs are within normal limits.  I have reviewed the triage vital signs and the nursing notes. Pertinent labs & imaging results that were available during my care of the patient were reviewed by me and considered in my medical decision making (see chart for details). After history, exam, and medical workup I feel the patient has been appropriately medically screened and is safe for discharge home. Pertinent diagnoses were discussed with the patient. Patient was given return precautions.    Rx / DC Orders ED Discharge Orders     None         Jazaria Jarecki, MD 06/23/23 2328

## 2023-06-23 NOTE — ED Triage Notes (Signed)
 Pt reports bilateral leg swelling/foot swelling.  Feels that her eyes are swollen as well.  Pt reports she was recently diagnosed with diabetes but is on no medication was just told to change her lifestyle.
